# Patient Record
Sex: Female | Born: 1995
Health system: Southern US, Community
[De-identification: ages and names within clinical notes are randomized; demographics above are authoritative.]

## PROBLEM LIST (undated history)

## (undated) ENCOUNTER — Emergency Department (HOSPITAL_COMMUNITY): Admission: EM | Disposition: A | Discharge status: Home / Self Care | Payer: Self-pay

## (undated) DIAGNOSIS — Z789 Other specified health status: Secondary | ICD-10-CM

## (undated) DIAGNOSIS — O24419 Gestational diabetes mellitus in pregnancy, unspecified control: Secondary | ICD-10-CM

## (undated) HISTORY — DX: Gestational diabetes mellitus in pregnancy, unspecified control: O24.419

## (undated) HISTORY — DX: Other specified health status: Z78.9

## (undated) HISTORY — PX: NO PAST SURGERIES: SHX2092

---

## 2019-11-24 ENCOUNTER — Encounter (HOSPITAL_COMMUNITY): Payer: Self-pay | Admitting: Emergency Medicine

## 2019-11-24 ENCOUNTER — Inpatient Hospital Stay (HOSPITAL_COMMUNITY)
Admission: EM | Admit: 2019-11-24 | Discharge: 2019-11-26 | DRG: 392 | Disposition: A | Discharge status: Home / Self Care | Payer: Self-pay | Attending: Internal Medicine | Admitting: Internal Medicine

## 2019-11-24 ENCOUNTER — Emergency Department (HOSPITAL_COMMUNITY): Payer: Self-pay

## 2019-11-24 ENCOUNTER — Other Ambulatory Visit: Payer: Self-pay

## 2019-11-24 DIAGNOSIS — K5732 Diverticulitis of large intestine without perforation or abscess without bleeding: Principal | ICD-10-CM | POA: Diagnosis present

## 2019-11-24 DIAGNOSIS — R6510 Systemic inflammatory response syndrome (SIRS) of non-infectious origin without acute organ dysfunction: Secondary | ICD-10-CM | POA: Diagnosis present

## 2019-11-24 DIAGNOSIS — R935 Abnormal findings on diagnostic imaging of other abdominal regions, including retroperitoneum: Secondary | ICD-10-CM

## 2019-11-24 DIAGNOSIS — Z20822 Contact with and (suspected) exposure to covid-19: Secondary | ICD-10-CM | POA: Diagnosis present

## 2019-11-24 DIAGNOSIS — K5792 Diverticulitis of intestine, part unspecified, without perforation or abscess without bleeding: Secondary | ICD-10-CM | POA: Diagnosis present

## 2019-11-24 HISTORY — DX: Diverticulitis of large intestine without perforation or abscess without bleeding: K57.32

## 2019-11-24 LAB — URINALYSIS, ROUTINE W REFLEX MICROSCOPIC
Bilirubin Urine: NEGATIVE
Bilirubin Urine: NEGATIVE
Glucose, UA: NEGATIVE mg/dL
Glucose, UA: NEGATIVE mg/dL
Hgb urine dipstick: NEGATIVE
Hgb urine dipstick: NEGATIVE
Ketones, ur: 20 mg/dL — AB
Ketones, ur: 80 mg/dL — AB
Leukocytes,Ua: NEGATIVE
Nitrite: NEGATIVE
Nitrite: NEGATIVE
Protein, ur: 30 mg/dL — AB
Protein, ur: NEGATIVE mg/dL
Specific Gravity, Urine: 1.027 (ref 1.005–1.030)
Specific Gravity, Urine: >1.046 — ABNORMAL HIGH (ref 1.005–1.030)
pH: 5 (ref 5.0–8.0)
pH: 5 (ref 5.0–8.0)

## 2019-11-24 LAB — LACTIC ACID, PLASMA: Lactic Acid, Venous: 0.8 mmol/L (ref 0.5–1.9)

## 2019-11-24 LAB — CBC
HCT: 40.2 % (ref 36.0–46.0)
Hemoglobin: 12.9 g/dL (ref 12.0–15.0)
MCH: 28.5 pg (ref 26.0–34.0)
MCHC: 32.1 g/dL (ref 30.0–36.0)
MCV: 88.9 fL (ref 80.0–100.0)
Platelets: 243 10*3/uL (ref 150–400)
RBC: 4.52 MIL/uL (ref 3.87–5.11)
RDW: 13.6 % (ref 11.5–15.5)
WBC: 14.1 10*3/uL — ABNORMAL HIGH (ref 4.0–10.5)
nRBC: 0 % (ref 0.0–0.2)

## 2019-11-24 LAB — COMPREHENSIVE METABOLIC PANEL
ALT: 15 U/L (ref 0–44)
AST: 16 U/L (ref 15–41)
Albumin: 3.5 g/dL (ref 3.5–5.0)
Alkaline Phosphatase: 77 U/L (ref 38–126)
Anion gap: 9 (ref 5–15)
BUN: 7 mg/dL (ref 6–20)
CO2: 25 mmol/L (ref 22–32)
Calcium: 8.8 mg/dL — ABNORMAL LOW (ref 8.9–10.3)
Chloride: 102 mmol/L (ref 98–111)
Creatinine, Ser: 0.66 mg/dL (ref 0.44–1.00)
GFR, Estimated: >60 mL/min (ref >60)
Glucose, Bld: 109 mg/dL — ABNORMAL HIGH (ref 70–99)
Potassium: 4 mmol/L (ref 3.5–5.1)
Sodium: 136 mmol/L (ref 135–145)
Total Bilirubin: 0.6 mg/dL (ref 0.3–1.2)
Total Protein: 7.5 g/dL (ref 6.5–8.1)

## 2019-11-24 LAB — RESPIRATORY PANEL BY RT PCR (FLU A&B, COVID)
Influenza A by PCR: NEGATIVE
Influenza B by PCR: NEGATIVE
SARS Coronavirus 2 by RT PCR: NEGATIVE

## 2019-11-24 LAB — I-STAT BETA HCG BLOOD, ED (MC, WL, AP ONLY): I-stat hCG, quantitative: <5 m[IU]/mL (ref <5)

## 2019-11-24 LAB — LIPASE, BLOOD: Lipase: 23 U/L (ref 11–51)

## 2019-11-24 MED ORDER — METRONIDAZOLE 500 MG PO TABS
500.0000 mg | ORAL_TABLET | Freq: Three times a day (TID) | ORAL | Status: DC
  Administered 2019-11-24: 500 mg via ORAL
  Filled 2019-11-24: qty 1

## 2019-11-24 MED ORDER — IOHEXOL 300 MG/ML  SOLN
100.0000 mL | Freq: Once | INTRAMUSCULAR | Status: AC | PRN
  Administered 2019-11-24: 100 mL via INTRAVENOUS

## 2019-11-24 MED ORDER — SODIUM CHLORIDE 0.9 % IV SOLN: Freq: Once | INTRAVENOUS | Status: AC

## 2019-11-24 MED ORDER — OXYCODONE-ACETAMINOPHEN 5-325 MG PO TABS
1.0000 | ORAL_TABLET | Freq: Once | ORAL | Status: AC
  Administered 2019-11-24: 1 via ORAL
  Filled 2019-11-24: qty 1

## 2019-11-24 MED ORDER — SODIUM CHLORIDE 0.9 % IV SOLN
2.0000 g | Freq: Once | INTRAVENOUS | Status: AC
  Administered 2019-11-24: 2 g via INTRAVENOUS
  Filled 2019-11-24: qty 20

## 2019-11-24 MED ORDER — MORPHINE SULFATE (PF) 4 MG/ML IV SOLN
4.0000 mg | Freq: Once | INTRAVENOUS | Status: AC
  Administered 2019-11-24: 4 mg via INTRAVENOUS
  Filled 2019-11-24: qty 1

## 2019-11-24 NOTE — ED Triage Notes (Signed)
Patient arrives to ED with complaints of RLQ abdominal pain. Pt describes the pain has sharp. Onset x2 days ago. Pain is constant and not relieved. Denies N/V.

## 2019-11-24 NOTE — ED Provider Notes (Signed)
[facility] EMERGENCY DEPARTMENT Provider Note   CSN: [id number] Arrival date & time: 11/24/19  1144     History Chief Complaint  Patient presents with  . Abdominal Pain    Leah Garcia Leah Garcia is a 24 y.o. female with no previous PMH presents to ER for evaluation of abdominal pain. Onset 2 days ago. Feels like a cramp like she has to have a bowel movement. Located in right upper/middle abdomen with radiation to right middle back. Constant. Currently 6/10. Not worse with meals. Worse with sitting up, moving.  Has tried acetaminophen which has not helped. No associated fever, nausea, vomiting. No associated chest or shoulder pain. No cough, SOB. No diarrhea. Feels a little constipated, last BM this morning "small".  Passing flatus. No dysuria, hematuria, frequency. No abdominal vaginal bleeding or discharge. Irregular menses due to Nexplanon. No previous abdominal surgeries.   HPI     History reviewed. No pertinent past medical history.  There are no problems to display for this patient.  ** The histories are not reviewed yet. Please review them in the "History" navigator section and refresh this SmartLink.   OB History   No obstetric history on file.     History reviewed. No pertinent family history.  Social History   Tobacco Use  . Smoking status: Not on file  Substance Use Topics  . Alcohol use: Not on file  . Drug use: Not on file    Home Medications Prior to Admission medications   Not on File    Allergies    Patient has no known allergies.  Review of Systems   Review of Systems  Gastrointestinal: Positive for abdominal pain.  All other systems reviewed and are negative.   Physical Exam Updated Vital Signs BP 115/65   Pulse 91   Temp 99.7 F (37.6 C) (Oral)   Resp 20   SpO2 100%   Physical Exam Vitals and nursing note reviewed.  Constitutional:      Appearance: She is well-developed.     Comments: Non toxic in NAD    HENT:     Head: Normocephalic and atraumatic.     Nose: Nose normal.  Eyes:     Conjunctiva/sclera: Conjunctivae normal.  Cardiovascular:     Rate and Rhythm: Normal rate and regular rhythm.  Pulmonary:     Effort: Pulmonary effort is normal.     Breath sounds: Normal breath sounds.  Abdominal:     General: Bowel sounds are normal.     Palpations: Abdomen is soft.     Tenderness: There is abdominal tenderness (RUQ and right mid abdominal) in the right upper quadrant. Negative signs include Murphy's sign.       Comments: Diffuse right sided tenderness, point of maximal tenderness in right mid abdomen. Less tenderness in RLQ. Guarding mostly with deep palpation right mid abdomen. Soft +Murphy's and McBurney's. Obese. Soft. Non distended no fluid wave. Nosuprapubic or CVA tenderness.  Active BS to lower quadrants. No rash to abdomen/back   Musculoskeletal:        General: Normal range of motion.     Cervical back: Normal range of motion.  Skin:    General: Skin is warm and dry.     Capillary Refill: Capillary refill takes less than 2 seconds.  Neurological:     Mental Status: She is alert.  Psychiatric:        Behavior: Behavior normal.     ED Results / Procedures / Treatments  Labs (all labs ordered are listed, but only abnormal results are displayed) Labs Reviewed  COMPREHENSIVE METABOLIC PANEL - Abnormal; Notable for the following components:      Result Value   Glucose, Bld 109 (*)    Calcium 8.8 (*)    All other components within normal limits  CBC - Abnormal; Notable for the following components:   WBC 14.1 (*)    All other components within normal limits  URINALYSIS, ROUTINE W REFLEX MICROSCOPIC - Abnormal; Notable for the following components:   Color, Urine AMBER (*)    APPearance CLOUDY (*)    Ketones, ur 20 (*)    Protein, ur 30 (*)    Leukocytes,Ua MODERATE (*)    Bacteria, UA RARE (*)    All other components within normal limits  URINE CULTURE  URINE  CULTURE  CULTURE, BLOOD (ROUTINE X 2)  CULTURE, BLOOD (ROUTINE X 2)  LIPASE, BLOOD  URINALYSIS, ROUTINE W REFLEX MICROSCOPIC  LACTIC ACID, PLASMA  LACTIC ACID, PLASMA  I-STAT BETA HCG BLOOD, ED ([facility], [facility], [facility] ONLY)    EKG None  Radiology US Abdomen Complete  Result Date: 11/24/2019 CLINICAL DATA:  Right upper quadrant pain. Evaluate gallbladder and right kidney. EXAM: ABDOMEN ULTRASOUND COMPLETE COMPARISON:  None. FINDINGS: Gallbladder: No gallstones or wall thickening visualized. No sonographic Murphy sign noted by sonographer. Common bile duct: Diameter: 3 mm Liver: No focal lesion identified. Within normal limits in parenchymal echogenicity. Portal vein is patent on color Doppler imaging with normal direction of blood flow towards the liver. IVC: No abnormality visualized. Pancreas: Visualized portion unremarkable. Spleen: Size and appearance within normal limits. Right Kidney: Length: 10.8 cm. Echogenicity within normal limits. No mass or hydronephrosis visualized. Left Kidney: Length: 10.8 cm. Echogenicity within normal limits. No mass or hydronephrosis visualized. Abdominal aorta: No aneurysm visualized. Other findings: None. IMPRESSION: No cause for pain identified. The gallbladder and right kidney in particular normal in appearance. Electronically Signed   By: Gerome Samavid  Williams III M.D   On: 11/24/2019 17:18   CT ABDOMEN PELVIS W CONTRAST  Result Date: 11/24/2019 CLINICAL DATA:  Two days of right upper quadrant pain EXAM: CT ABDOMEN AND PELVIS WITH CONTRAST TECHNIQUE: Multidetector CT imaging of the abdomen and pelvis was performed using the standard protocol following bolus administration of intravenous contrast. CONTRAST:  100mL OMNIPAQUE IOHEXOL 300 MG/ML  SOLN COMPARISON:  Abdominal ultrasound 11/24/2019 FINDINGS: Lower chest: Lung bases are clear. Normal heart size. No pericardial effusion. Hepatobiliary: No worrisome focal liver lesions. Smooth liver surface contour. Normal hepatic  attenuation. Normal gallbladder and biliary tree. Pancreas: No pancreatic ductal dilatation or surrounding inflammatory changes. Spleen: Normal in size. No concerning splenic lesions. Adrenals/Urinary Tract: Normal adrenal glands. Kidneys are normally located with symmetric enhancement. No suspicious renal lesion, urolithiasis or hydronephrosis. Urinary bladder is largely decompressed at the time of exam and therefore poorly evaluated by CT imaging. Mild wall thickening likely related to underdistention without other gross bladder abnormality. Stomach/Bowel: Distal esophagus, stomach and duodenum are unremarkable. No localized small bowel thickening or dilatation. There is phlegmon and inflammatory changes which appear largely centered upon the ascending colon with a likely culprit diverticulum (6/62, 3/57) while inflammation is present in the vicinity of the appendix, this is favored to be reactive though a small tip appendicitis cannot be fully excluded given some more focal thickening and enhancement at this level (6/68, 3/58). More distal colonic segments are unremarkable. Vascular/Lymphatic: Reactive adenopathy in the right lower quadrant. No pathologically enlarged abdominopelvic nodes. No  significant vascular abnormalities. Reproductive: Retroflexed uterus.  No concerning adnexal lesions. Other: Trace fluid and phlegmon about the ascending colon and cecum, largely centered upon the suspected culprit diverticulum. Trace simple attenuation free fluid in the pelvis, likely physiologic or redistributed inflammatory fluid. No free air. No organized collection or abscess. Musculoskeletal: No acute osseous abnormality or suspicious osseous lesion. Mild degenerative changes in the lower lumbar levels IMPRESSION: 1. Inflammatory changes appear centered upon a culprit diverticulum of the ascending colon favoring acute right-sided diverticulitis. 2. Inflammation adjacent the appendix is favored to be reactive though a  small tip appendicitis cannot be fully excluded given some more focal thickening and enhancement at this level. 3. Trace simple attenuation free fluid in the pelvis, likely physiologic or redistributed inflammatory fluid. 4. Mild bladder wall thickening likely related to underdistention without other gross bladder abnormality. Recommend assessment for urinary symptoms and consider urinalysis if present to exclude cystitis. Electronically Signed   By: Kreg Shropshire M.D.   On: 11/24/2019 20:27    Procedures Procedures (including critical care time)  Medications Ordered in ED Medications  cefTRIAXone (ROCEPHIN) 2 g in sodium chloride 0.9 % 100 mL IVPB (has no administration in time range)    And  metroNIDAZOLE (FLAGYL) tablet 500 mg (has no administration in time range)  0.9 %  sodium chloride infusion (has no administration in time range)  morphine 4 MG/ML injection 4 mg (has no administration in time range)  oxyCODONE-acetaminophen (PERCOCET/ROXICET) 5-325 MG per tablet 1 tablet (1 tablet Oral Given 11/24/19 1720)  iohexol (OMNIPAQUE) 300 MG/ML solution 100 mL (100 mLs Intravenous Contrast Given 11/24/19 1951)    ED Course  I have reviewed the triage vital signs and the nursing notes.  Pertinent labs & imaging results that were available during my care of the patient were reviewed by me and considered in my medical decision making (see chart for details).  Clinical Course as of Nov 23 2104  Sat Nov 24, 2019  1751 Glori LuisMarland Kitchen): MODERATE [CG]  1805 RBC / HPF: 6-10 [CG]  1805 WBC, UA: 11-20 [CG]  1805 Bacteria, UA(!): RARE [CG]  1805 Squamous Epithelial / LPF: 21-50 [CG]  2037 IMPRESSION: 1. Inflammatory changes appear centered upon a culprit diverticulum of the ascending colon favoring acute right-sided diverticulitis. 2. Inflammation adjacent the appendix is favored to be reactive though a small tip appendicitis cannot be fully excluded given some more focal thickening and enhancement  at this level. 3. Trace simple attenuation free fluid in the pelvis, likely physiologic or redistributed inflammatory fluid. 4. Mild bladder wall thickening likely related to underdistention without other gross bladder abnormality. Recommend assessment for urinary symptoms and consider urinalysis if present to exclude cystitis.    CT ABDOMEN PELVIS W CONTRAST [CG]    Clinical Course User Index [CG] Jerrell Mylar   MDM Rules/Calculators/A&P                          EMR, triage nurse notes reviewed to assist with history and MDM.  No significant medical history on EMR.  Healthy 24 year old F here for diffuse right-sided abdominal tenderness.  Maximal point of tenderness is in the mid right abdomen.  Reports feeling slightly constipated but had one small BM earlier this morning, still passing gas.  No fever, nausea, vomiting, urinary symptoms.  Pain is constant worse with sitting up, moving and focal pressure.  She has no vaginal symptoms.  Ddx viral gastro vs cholecystitis vs cholilithiasis  vs UTI/stone vs appy. Doubt pelvic process like torsion, PID, TOA.   ER work-up initiated in triage including lab work.  ER work-up personally visualized and interpreted  Lab work reviewed-mild leukocytosis 14.1.  Urinalysis is a poor sample with 21-50 squamous epithelial cells, rare bacteria, 11-20 RBC, 6-10 RBCs, moderate leukocytes but no nitrites.  Patient has no UTI symptoms.  No history of kidney stones.  Imaging reviewed-RUQ ultrasound obtained initially giving area of focal tenderness, this is negative for gallbladder pathology, right kidney without hydronephrosis.  Patient was reevaluated, serial abdominal exams with persistent right sided mostly middle abdominal tenderness.  Given continued pain, leukocytosis concern for appendicitis, diverticulitis.  Less likely UTI, kidney stone or pyelonephritis as she has no UTI symptoms.  Will obtain a CT A/P.  2045: CT A/P personally reviewed  and interpreted with EDP.  There are inflammatory changes centered around a diverticulum of the ascending colon, findings favoring acute right-sided diverticulitis.  Some inflammation adjacent to the appendix favored to be reactive but tip appendicitis cannot be excluded.  Some bladder wall thickening as well likely related from under distention, again patient without UTI symptoms.  Will attempt to recollect clean catch urinalysis and send for culture.  2103: Spoke to Dr Bedelia Person with surgery. Recommends admission, IV. GSY will consult. Discussed with patient, reports continued pain. TTP right mid abdomen, umbilicus, RLQ.   Meds ordered: rocephin/flagyl, morphine, NS infusion.  Final Clinical Impression(s) / ED Diagnoses Final diagnoses:  SIRS (systemic inflammatory response syndrome) (HCC)  Abnormal CT of the abdomen    Rx / DC Orders ED Discharge Orders    None       Jerrell Mylar 11/24/19 2106    Charlynne Pander, MD 11/24/19 2239

## 2019-11-24 NOTE — Care Management (Signed)
Assigned PCP [facility] needs to call on Monday and make appointment to follow up.

## 2019-11-24 NOTE — Consult Note (Signed)
Reason for Consult/Chief Complaint: diverticulitis Consultant: Margarette Asal, [place]  Vertis Kelch [name] is an 24 y.o. female.   HPI: 57F with one day history of right lower quadrant abdominal pain with no other associated symptoms, including nausea or vomiting. Reports a normal bowel movement 11/19 and a small bowel movement this AM. Denies any blood in her stool. Denies a personal or family history of colon cancer, IBS, or Crohns. Denies a personal history of UC, reports maternal grandmother with UC, she thinks. No prior colonoscopy, never been seen by GI.   History reviewed. No pertinent past medical history.  History reviewed. No pertinent family history.  Social History:  has no history on file for tobacco use, alcohol use, and drug use.  Allergies: No Known Allergies  Medications: I have reviewed the patient's current medications.  Results for orders placed or performed during the hospital encounter of 11/24/19 (from the past 48 hour(s))  Lipase, blood     Status: None   Collection Time: 11/24/19 12:31 PM  Result Value Ref Range   Lipase 23 11 - 51 U/L    Comment: Performed at [facility], [address] [place], [place] [address]  Comprehensive metabolic panel     Status: Abnormal   Collection Time: 11/24/19 12:31 PM  Result Value Ref Range   Sodium 136 135 - 145 mmol/L   Potassium 4.0 3.5 - 5.1 mmol/L   Chloride 102 98 - 111 mmol/L   CO2 25 22 - 32 mmol/L   Glucose, Bld 109 (H) 70 - 99 mg/dL    Comment: Glucose reference range applies only to samples taken after fasting for at least 8 hours.   BUN 7 6 - 20 mg/dL   Creatinine, Ser 5.46 0.44 - 1.00 mg/dL   Calcium 8.8 (L) 8.9 - 10.3 mg/dL   Total Protein 7.5 6.5 - 8.1 g/dL   Albumin 3.5 3.5 - 5.0 g/dL   AST 16 15 - 41 U/L   ALT 15 0 - 44 U/L   Alkaline Phosphatase 77 38 - 126 U/L   Total Bilirubin 0.6 0.3 - 1.2 mg/dL   GFR, Estimated >56 >81 mL/min    Comment: (NOTE) Calculated using the CKD-EPI  Creatinine Equation (2021)    Anion gap 9 5 - 15    Comment: Performed at [facility], [address], [place], [place] [address]  CBC     Status: Abnormal   Collection Time: 11/24/19 12:31 PM  Result Value Ref Range   WBC 14.1 (H) 4.0 - 10.5 K/uL   RBC 4.52 3.87 - 5.11 MIL/uL   Hemoglobin 12.9 12.0 - 15.0 g/dL   HCT 00.1 36 - 46 %   MCV 88.9 80.0 - 100.0 fL   MCH 28.5 26.0 - 34.0 pg   MCHC 32.1 30.0 - 36.0 g/dL   RDW 74.9 44.9 - 67.5 %   Platelets 243 150 - 400 K/uL   nRBC 0.0 0.0 - 0.2 %    Comment: Performed at [facility], [address] [place], [place] [address]  I-Stat beta hCG blood, ED     Status: None   Collection Time: 11/24/19 12:55 PM  Result Value Ref Range   I-stat hCG, quantitative <5.0 <5 mIU/mL   Comment 3            Comment:   GEST. AGE      CONC.  (mIU/mL)   <=1 WEEK  5 - 50     2 WEEKS       50 - 500     3 WEEKS       100 - 10,000     4 WEEKS     1,000 - 30,000        FEMALE AND NON-PREGNANT FEMALE:     LESS THAN 5 mIU/mL   Urinalysis, Routine w reflex microscopic Urine, Clean Catch     Status: Abnormal   Collection Time: 11/24/19  5:19 PM  Result Value Ref Range   Color, Urine AMBER (A) YELLOW    Comment: BIOCHEMICALS MAY BE AFFECTED BY COLOR   APPearance CLOUDY (A) CLEAR   Specific Gravity, Urine 1.027 1.005 - 1.030   pH 5.0 5.0 - 8.0   Glucose, UA NEGATIVE NEGATIVE mg/dL   Hgb urine dipstick NEGATIVE NEGATIVE   Bilirubin Urine NEGATIVE NEGATIVE   Ketones, ur 20 (A) NEGATIVE mg/dL   Protein, ur 30 (A) NEGATIVE mg/dL   Nitrite NEGATIVE NEGATIVE   Leukocytes,Ua MODERATE (A) NEGATIVE   RBC / HPF 6-10 0 - 5 RBC/hpf   WBC, UA 11-20 0 - 5 WBC/hpf   Bacteria, UA RARE (A) NONE SEEN   Squamous Epithelial / LPF 21-50 0 - 5   Mucus PRESENT     Comment: Performed at [facility], [address], [place], [place] [address]    US Abdomen Complete  Result Date: 11/24/2019 CLINICAL DATA:  Right upper quadrant pain. Evaluate  gallbladder and right kidney. EXAM: ABDOMEN ULTRASOUND COMPLETE COMPARISON:  None. FINDINGS: Gallbladder: No gallstones or wall thickening visualized. No sonographic Murphy sign noted by sonographer. Common bile duct: Diameter: 3 mm Liver: No focal lesion identified. Within normal limits in parenchymal echogenicity. Portal vein is patent on color Doppler imaging with normal direction of blood flow towards the liver. IVC: No abnormality visualized. Pancreas: Visualized portion unremarkable. Spleen: Size and appearance within normal limits. Right Kidney: Length: 10.8 cm. Echogenicity within normal limits. No mass or hydronephrosis visualized. Left Kidney: Length: 10.8 cm. Echogenicity within normal limits. No mass or hydronephrosis visualized. Abdominal aorta: No aneurysm visualized. Other findings: None. IMPRESSION: No cause for pain identified. The gallbladder and right kidney in particular normal in appearance. Electronically Signed   By: Gerome Sam III M.D   On: 11/24/2019 17:18   CT ABDOMEN PELVIS W CONTRAST  Result Date: 11/24/2019 CLINICAL DATA:  Two days of right upper quadrant pain EXAM: CT ABDOMEN AND PELVIS WITH CONTRAST TECHNIQUE: Multidetector CT imaging of the abdomen and pelvis was performed using the standard protocol following bolus administration of intravenous contrast. CONTRAST:  OMNIPAQUE IOHEXOL 300 MG/ML  SOLN COMPARISON:  Abdominal ultrasound 11/24/2019 FINDINGS: Lower chest: Lung bases are clear. Normal heart size. No pericardial effusion. Hepatobiliary: No worrisome focal liver lesions. Smooth liver surface contour. Normal hepatic attenuation. Normal gallbladder and biliary tree. Pancreas: No pancreatic ductal dilatation or surrounding inflammatory changes. Spleen: Normal in size. No concerning splenic lesions. Adrenals/Urinary Tract: Normal adrenal glands. Kidneys are normally located with symmetric enhancement. No suspicious renal lesion, urolithiasis or hydronephrosis.  Urinary bladder is largely decompressed at the time of exam and therefore poorly evaluated by CT imaging. Mild wall thickening likely related to underdistention without other gross bladder abnormality. Stomach/Bowel: Distal esophagus, stomach and duodenum are unremarkable. No localized small bowel thickening or dilatation. There is phlegmon and inflammatory changes which appear largely centered upon the ascending colon with a likely culprit diverticulum (6/62, 3/57) while inflammation is present  in the vicinity of the appendix, this is favored to be reactive though a small tip appendicitis cannot be fully excluded given some more focal thickening and enhancement at this level (6/68, 3/58). More distal colonic segments are unremarkable. Vascular/Lymphatic: Reactive adenopathy in the right lower quadrant. No pathologically enlarged abdominopelvic nodes. No significant vascular abnormalities. Reproductive: Retroflexed uterus.  No concerning adnexal lesions. Other: Trace fluid and phlegmon about the ascending colon and cecum, largely centered upon the suspected culprit diverticulum. Trace simple attenuation free fluid in the pelvis, likely physiologic or redistributed inflammatory fluid. No free air. No organized collection or abscess. Musculoskeletal: No acute osseous abnormality or suspicious osseous lesion. Mild degenerative changes in the lower lumbar levels IMPRESSION: 1. Inflammatory changes appear centered upon a culprit diverticulum of the ascending colon favoring acute right-sided diverticulitis. 2. Inflammation adjacent the appendix is favored to be reactive though a small tip appendicitis cannot be fully excluded given some more focal thickening and enhancement at this level. 3. Trace simple attenuation free fluid in the pelvis, likely physiologic or redistributed inflammatory fluid. 4. Mild bladder wall thickening likely related to underdistention without other gross bladder abnormality. Recommend assessment  for urinary symptoms and consider urinalysis if present to exclude cystitis. Electronically Signed   By: Kreg Shropshire M.D.   On: 11/24/2019 20:27    ROS 10 point review of systems is negative except as listed above in HPI.   Physical Exam Blood pressure 107/75, pulse 93, temperature 99.3 F (37.4 C), temperature source Oral, resp. rate 20, SpO2 100 %. Constitutional: well-developed, well-nourished HEENT: pupils equal, round, reactive to light, 99mm b/l, moist conjunctiva, external inspection of ears and nose normal, hearing intact Oropharynx: normal oropharyngeal mucosa, normal dentition Neck: no thyromegaly, trachea midline, no midline cervical tenderness to palpation Chest: breath sounds equal bilaterally, normal respiratory effort, no midline or lateral chest wall tenderness to palpation/deformity Abdomen: soft, mild TTP with deep palpation in RLQ, no bruising, no hepatosplenomegaly GU: normal female genitalia  Back: no wounds, no thoracic/lumbar spine tenderness to palpation, no thoracic/lumbar spine stepoffs Rectal: deferred Extremities: 2+ radial and pedal pulses bilaterally, motor and sensation intact to bilateral UE and LE, no peripheral edema MSK: unable to assess gait/station, no clubbing/cyanosis of fingers/toes, normal ROM of all four extremities Skin: warm, dry, no rashes Psych: normal memory, normal mood/affect    Assessment/Plan: 70F with R sided diverticulitis  Recommend admission to [facility] for abx. Recommend GI c/s due to R sided presentation and young age; would recommend colonoscopy after resolution of this episode. Will continue to follow.   Diamantina Monks, MD General and Trauma Surgery [facility]

## 2019-11-25 ENCOUNTER — Encounter (HOSPITAL_COMMUNITY): Payer: Self-pay | Admitting: Internal Medicine

## 2019-11-25 DIAGNOSIS — R6510 Systemic inflammatory response syndrome (SIRS) of non-infectious origin without acute organ dysfunction: Secondary | ICD-10-CM | POA: Diagnosis present

## 2019-11-25 DIAGNOSIS — K5732 Diverticulitis of large intestine without perforation or abscess without bleeding: Principal | ICD-10-CM

## 2019-11-25 DIAGNOSIS — K5792 Diverticulitis of intestine, part unspecified, without perforation or abscess without bleeding: Secondary | ICD-10-CM | POA: Diagnosis present

## 2019-11-25 HISTORY — DX: Diverticulitis of intestine, part unspecified, without perforation or abscess without bleeding: K57.92

## 2019-11-25 HISTORY — DX: Systemic inflammatory response syndrome (sirs) of non-infectious origin without acute organ dysfunction: R65.10

## 2019-11-25 LAB — URINE CULTURE: Culture: <10000 — AB

## 2019-11-25 LAB — CBC WITH DIFFERENTIAL/PLATELET
Abs Immature Granulocytes: 0.05 10*3/uL (ref 0.00–0.07)
Basophils Absolute: 0 10*3/uL (ref 0.0–0.1)
Basophils Relative: 0 %
Eosinophils Absolute: 0.1 10*3/uL (ref 0.0–0.5)
Eosinophils Relative: 1 %
HCT: 34.1 % — ABNORMAL LOW (ref 36.0–46.0)
Hemoglobin: 11.3 g/dL — ABNORMAL LOW (ref 12.0–15.0)
Immature Granulocytes: 0 %
Lymphocytes Relative: 13 %
Lymphs Abs: 1.8 10*3/uL (ref 0.7–4.0)
MCH: 28.5 pg (ref 26.0–34.0)
MCHC: 33.1 g/dL (ref 30.0–36.0)
MCV: 85.9 fL (ref 80.0–100.0)
Monocytes Absolute: 1 10*3/uL (ref 0.1–1.0)
Monocytes Relative: 7 %
Neutro Abs: 10.8 10*3/uL — ABNORMAL HIGH (ref 1.7–7.7)
Neutrophils Relative %: 79 %
Platelets: 210 10*3/uL (ref 150–400)
RBC: 3.97 MIL/uL (ref 3.87–5.11)
RDW: 13.4 % (ref 11.5–15.5)
WBC: 13.8 10*3/uL — ABNORMAL HIGH (ref 4.0–10.5)
nRBC: 0 % (ref 0.0–0.2)

## 2019-11-25 LAB — COMPREHENSIVE METABOLIC PANEL
ALT: 13 U/L (ref 0–44)
AST: 12 U/L — ABNORMAL LOW (ref 15–41)
Albumin: 3 g/dL — ABNORMAL LOW (ref 3.5–5.0)
Alkaline Phosphatase: 69 U/L (ref 38–126)
Anion gap: 8 (ref 5–15)
BUN: 5 mg/dL — ABNORMAL LOW (ref 6–20)
CO2: 24 mmol/L (ref 22–32)
Calcium: 8.3 mg/dL — ABNORMAL LOW (ref 8.9–10.3)
Chloride: 103 mmol/L (ref 98–111)
Creatinine, Ser: 0.66 mg/dL (ref 0.44–1.00)
GFR, Estimated: >60 mL/min (ref >60)
Glucose, Bld: 103 mg/dL — ABNORMAL HIGH (ref 70–99)
Potassium: 3.5 mmol/L (ref 3.5–5.1)
Sodium: 135 mmol/L (ref 135–145)
Total Bilirubin: 1.1 mg/dL (ref 0.3–1.2)
Total Protein: 6.3 g/dL — ABNORMAL LOW (ref 6.5–8.1)

## 2019-11-25 LAB — LACTIC ACID, PLASMA: Lactic Acid, Venous: 0.6 mmol/L (ref 0.5–1.9)

## 2019-11-25 LAB — HIV ANTIBODY (ROUTINE TESTING W REFLEX): HIV Screen 4th Generation wRfx: NONREACTIVE

## 2019-11-25 MED ORDER — MORPHINE SULFATE (PF) 2 MG/ML IV SOLN: 2.0000 mg | INTRAVENOUS | Status: DC | PRN

## 2019-11-25 MED ORDER — ENOXAPARIN SODIUM 40 MG/0.4ML ~~LOC~~ SOLN
40.0000 mg | Freq: Every day | SUBCUTANEOUS | Status: DC
  Administered 2019-11-25 – 2019-11-26 (×2): 40 mg via SUBCUTANEOUS
  Filled 2019-11-25 (×2): qty 0.4

## 2019-11-25 MED ORDER — PIPERACILLIN-TAZOBACTAM 3.375 G IVPB: 3.3750 g | Freq: Four times a day (QID) | INTRAVENOUS | Status: DC

## 2019-11-25 MED ORDER — POLYETHYLENE GLYCOL 3350 17 G PO PACK: 17.0000 g | PACK | Freq: Every day | ORAL | Status: DC | PRN

## 2019-11-25 MED ORDER — LACTATED RINGERS IV SOLN: INTRAVENOUS | Status: AC

## 2019-11-25 MED ORDER — MORPHINE SULFATE (PF) 4 MG/ML IV SOLN
4.0000 mg | INTRAVENOUS | Status: DC | PRN
  Administered 2019-11-25: 4 mg via INTRAVENOUS
  Filled 2019-11-25: qty 1

## 2019-11-25 MED ORDER — ONDANSETRON HCL 4 MG/2ML IJ SOLN: 4.0000 mg | Freq: Four times a day (QID) | INTRAMUSCULAR | Status: DC | PRN

## 2019-11-25 MED ORDER — LACTATED RINGERS IV BOLUS
1000.0000 mL | Freq: Once | INTRAVENOUS | Status: AC
  Administered 2019-11-25: 1000 mL via INTRAVENOUS

## 2019-11-25 MED ORDER — ACETAMINOPHEN 325 MG PO TABS
650.0000 mg | ORAL_TABLET | Freq: Four times a day (QID) | ORAL | Status: DC | PRN
  Administered 2019-11-25 (×2): 650 mg via ORAL
  Filled 2019-11-25 (×2): qty 2

## 2019-11-25 MED ORDER — ACETAMINOPHEN 650 MG RE SUPP: 650.0000 mg | Freq: Four times a day (QID) | RECTAL | Status: DC | PRN

## 2019-11-25 MED ORDER — PIPERACILLIN-TAZOBACTAM 3.375 G IVPB
3.3750 g | Freq: Three times a day (TID) | INTRAVENOUS | Status: DC
  Administered 2019-11-25 – 2019-11-26 (×5): 3.375 g via INTRAVENOUS
  Filled 2019-11-25 (×6): qty 50

## 2019-11-25 MED ORDER — ONDANSETRON HCL 4 MG PO TABS: 4.0000 mg | ORAL_TABLET | Freq: Four times a day (QID) | ORAL | Status: DC | PRN

## 2019-11-25 NOTE — Progress Notes (Signed)
Subjective/Chief Complaint: Pt with some con't pain not worse   Objective: Vital signs in last 24 hours: Temp:  [99.1 F (37.3 C)-99.7 F (37.6 C)] 99.6 F (37.6 C) (11/21 0419) Pulse Rate:  [91-106] 93 (11/21 0906) Resp:  [18-31] 20 (11/21 0419) BP: (96-122)/(60-84) 99/65 (11/21 0906) SpO2:  [97 %-100 %] 97 % (11/21 0419) Weight:  [81.6 kg] 81.6 kg (11/20 2310) Last BM Date: 11/25/19  Intake/Output from previous day: 11/20 0701 - 11/21 0700 In: 120 [P.O.:120] Out: -  Intake/Output this shift: No intake/output data recorded.  PE:  Constitutional: No acute distress, conversant, appears states age. Eyes: Anicteric sclerae, moist conjunctiva, no lid lag Lungs: Clear to auscultation bilaterally, normal respiratory effort CV: regular rate and rhythm, no murmurs, no peripheral edema, pedal pulses 2+ GI: Soft, no masses or hepatosplenomegaly, min-tender to palpation to RLQ Skin: No rashes, palpation reveals normal turgor Psychiatric: appropriate judgment and insight, oriented to person, place, and time   Lab Results:  Recent Labs    11/24/19 1231 11/25/19 0536  WBC 14.1* 13.8*  HGB 12.9 11.3*  HCT 40.2 34.1*  PLT 243 210   BMET Recent Labs    11/24/19 1231 11/25/19 0536  NA 136 135  K 4.0 3.5  CL 102 103  CO2 25 24  GLUCOSE 109* 103*  BUN 7 5*  CREATININE 0.66 0.66  CALCIUM 8.8* 8.3*   PT/INR No results for input(s): LABPROT, INR in the last 72 hours. ABG No results for input(s): PHART, HCO3 in the last 72 hours.  Invalid input(s): PCO2, PO2  Studies/Results: US Abdomen Complete  Result Date: 11/24/2019 CLINICAL DATA:  Right upper quadrant pain. Evaluate gallbladder and right kidney. EXAM: ABDOMEN ULTRASOUND COMPLETE COMPARISON:  None. FINDINGS: Gallbladder: No gallstones or wall thickening visualized. No sonographic Murphy sign noted by sonographer. Common bile duct: Diameter: 3 mm Liver: No focal lesion identified. Within normal limits in  parenchymal echogenicity. Portal vein is patent on color Doppler imaging with normal direction of blood flow towards the liver. IVC: No abnormality visualized. Pancreas: Visualized portion unremarkable. Spleen: Size and appearance within normal limits. Right Kidney: Length: 10.8 cm. Echogenicity within normal limits. No mass or hydronephrosis visualized. Left Kidney: Length: 10.8 cm. Echogenicity within normal limits. No mass or hydronephrosis visualized. Abdominal aorta: No aneurysm visualized. Other findings: None. IMPRESSION: No cause for pain identified. The gallbladder and right kidney in particular normal in appearance. Electronically Signed   By: Gerome Sam III M.D   On: 11/24/2019 17:18   CT ABDOMEN PELVIS W CONTRAST  Result Date: 11/24/2019 CLINICAL DATA:  Two days of right upper quadrant pain EXAM: CT ABDOMEN AND PELVIS WITH CONTRAST TECHNIQUE: Multidetector CT imaging of the abdomen and pelvis was performed using the standard protocol following bolus administration of intravenous contrast. CONTRAST:  OMNIPAQUE IOHEXOL 300 MG/ML  SOLN COMPARISON:  Abdominal ultrasound 11/24/2019 FINDINGS: Lower chest: Lung bases are clear. Normal heart size. No pericardial effusion. Hepatobiliary: No worrisome focal liver lesions. Smooth liver surface contour. Normal hepatic attenuation. Normal gallbladder and biliary tree. Pancreas: No pancreatic ductal dilatation or surrounding inflammatory changes. Spleen: Normal in size. No concerning splenic lesions. Adrenals/Urinary Tract: Normal adrenal glands. Kidneys are normally located with symmetric enhancement. No suspicious renal lesion, urolithiasis or hydronephrosis. Urinary bladder is largely decompressed at the time of exam and therefore poorly evaluated by CT imaging. Mild wall thickening likely related to underdistention without other gross bladder abnormality. Stomach/Bowel: Distal esophagus, stomach and duodenum are unremarkable. No localized small  bowel thickening or dilatation. There is phlegmon and inflammatory changes which appear largely centered upon the ascending colon with a likely culprit diverticulum (6/62, 3/57) while inflammation is present in the vicinity of the appendix, this is favored to be reactive though a small tip appendicitis cannot be fully excluded given some more focal thickening and enhancement at this level (6/68, 3/58). More distal colonic segments are unremarkable. Vascular/Lymphatic: Reactive adenopathy in the right lower quadrant. No pathologically enlarged abdominopelvic nodes. No significant vascular abnormalities. Reproductive: Retroflexed uterus.  No concerning adnexal lesions. Other: Trace fluid and phlegmon about the ascending colon and cecum, largely centered upon the suspected culprit diverticulum. Trace simple attenuation free fluid in the pelvis, likely physiologic or redistributed inflammatory fluid. No free air. No organized collection or abscess. Musculoskeletal: No acute osseous abnormality or suspicious osseous lesion. Mild degenerative changes in the lower lumbar levels IMPRESSION: 1. Inflammatory changes appear centered upon a culprit diverticulum of the ascending colon favoring acute right-sided diverticulitis. 2. Inflammation adjacent the appendix is favored to be reactive though a small tip appendicitis cannot be fully excluded given some more focal thickening and enhancement at this level. 3. Trace simple attenuation free fluid in the pelvis, likely physiologic or redistributed inflammatory fluid. 4. Mild bladder wall thickening likely related to underdistention without other gross bladder abnormality. Recommend assessment for urinary symptoms and consider urinalysis if present to exclude cystitis. Electronically Signed   By: Kreg Shropshire M.D.   On: 11/24/2019 20:27    Anti-infectives: Anti-infectives (From admission, onward)   Start     Dose/Rate Route Frequency Ordered Stop   11/26/19 0000   piperacillin-tazobactam (ZOSYN) IVPB 3.375 g  Status:  Discontinued        3.375 g 12.5 mL/hr over 240 Minutes Intravenous Every 6 hours 11/25/19 0018 11/25/19 0022   11/25/19 0200  piperacillin-tazobactam (ZOSYN) IVPB 3.375 g        3.375 g 12.5 mL/hr over 240 Minutes Intravenous Every 8 hours 11/25/19 0022     11/24/19 2200  metroNIDAZOLE (FLAGYL) tablet 500 mg  Status:  Discontinued       "And" Linked Group Details   500 mg Oral Every 8 hours 11/24/19 2101 11/25/19 0018   11/24/19 2115  cefTRIAXone (ROCEPHIN) 2 g in sodium chloride 0.9 % 100 mL IVPB       "And" Linked Group Details   2 g 200 mL/hr over 30 Minutes Intravenous  Once 11/24/19 2101 11/24/19 2253      Assessment/Plan: 29F with R sided diverticulitis  --con't abx -no plans surgery at this time -will  Need out pt c-scope. I d/w Dr. Ewing Schlein -adv to [facility]   LOS: 0 days    Axel Filler 11/25/2019

## 2019-11-25 NOTE — Progress Notes (Signed)
PROGRESS NOTE    Leah Garcia  TGG:[id number]  DOB: July 24, 1995  PCP: Patient, No Pcp Per Admit date:11/24/2019 Chief compliant: Abdominal pain 24 year old Spanish-speaking female with no past medical history who presents to [facility] emergency department with complaints of progressive abdominal pain over 2 days-right paraumbilical/lower quadrant on presentation.Patient denies any fever, dysuria, hematuria, flank pain diarrhea, nausea or vomiting.  Patient denies any sick contacts.  Patient denies regular alcohol use  (once weekly on the weekends).  ED Course: Afebrile, right upper quadrant USG did not show any evidence of hepatobiliary disease.  CT abdomen/pelvis showed ascending colon inflammation likely secondary to right-sided diverticulitis.  Since inflammation adjacent to appendix, general surgery was consulted who ruled out appendicitis and recommended admission for IV antibiotics for diverticulitis. Hospital course: Patient admitted to [facility] with IV antibiotics, clear liquid diet.   Subjective: Patient reports 5/10 right mid abdominal/paraumbilical pain.  Somewhat tachycardic with heart rate in low 100s.  Afebrile (T-max 99.70F).  Tolerating clear liquid diet okay  Objective: Vitals:   11/25/19 0001 11/25/19 0419 11/25/19 0906 11/25/19 1247  BP: 112/69 107/76 99/65 106/61  Pulse: (!) 101 (!) 102 93 84  Resp: 20 20  18   Temp: 99.1 F (37.3 C) 99.6 F (37.6 C)  98.7 F (37.1 C)  TempSrc: Oral Oral  Oral  SpO2: 100% 97%  98%  Weight:      Height:        Intake/Output Summary (Last 24 hours) at 11/25/2019 1442 Last data filed at 11/25/2019 0600 Gross per 24 hour  Intake 120 ml  Output --  Net 120 ml   Filed Weights   11/24/19 2310  Weight: 81.6 kg    Physical Examination:  General: Young female resting comfortably, no acute distress noted Head ENT: Atraumatic normocephalic, PERRLA, neck supple Heart: S1-S2 heard, regular rate and rhythm,  no murmurs.  No leg edema noted Lungs: Equal air entry bilaterally, no rhonchi or rales on exam, no accessory muscle use Abdomen: Bowel sounds heard, soft, right mid abdominal/lower quadrant tender to palpation without guarding or rebound, nondistended. No organomegaly.  No CVA tenderness Extremities: No pedal edema.  No cyanosis or clubbing. Neurological: Awake alert oriented x3, no focal weakness or numbness, strength and sensations to crude touch intact Skin: No wounds or rashes.   Data Reviewed: I have personally reviewed following labs and imaging studies  CBC: Recent Labs  Lab 11/24/19 1231 11/25/19 0536  WBC 14.1* 13.8*  NEUTROABS  --  10.8*  HGB 12.9 11.3*  HCT 40.2 34.1*  MCV 88.9 85.9  PLT 243 210   Basic Metabolic Panel: Recent Labs  Lab 11/24/19 1231 11/25/19 0536  NA 136 135  K 4.0 3.5  CL 102 103  CO2 25 24  GLUCOSE 109* 103*  BUN 7 5*  CREATININE 0.66 0.66  CALCIUM 8.8* 8.3*   GFR: Estimated Creatinine Clearance: 116.3 mL/min (by C-G formula based on SCr of 0.66 mg/dL). Liver Function Tests: Recent Labs  Lab 11/24/19 1231 11/25/19 0536  AST 16 12*  ALT 15 13  ALKPHOS 77 69  BILITOT 0.6 1.1  PROT 7.5 6.3*  ALBUMIN 3.5 3.0*   Recent Labs  Lab 11/24/19 1231  LIPASE 23   No results for input(s): AMMONIA in the last 168 hours. Coagulation Profile: No results for input(s): INR, PROTIME in the last 168 hours. Cardiac Enzymes: No results for input(s): CKTOTAL, CKMB, CKMBINDEX, TROPONINI in the last 168 hours. BNP (last 3 results) No results  for input(s): PROBNP in the last 8760 hours. HbA1C: No results for input(s): HGBA1C in the last 72 hours. CBG: No results for input(s): GLUCAP in the last 168 hours. Lipid Profile: No results for input(s): CHOL, HDL, LDLCALC, TRIG, CHOLHDL, LDLDIRECT in the last 72 hours. Thyroid Function Tests: No results for input(s): TSH, T4TOTAL, FREET4, T3FREE, THYROIDAB in the last 72 hours. Anemia Panel: No  results for input(s): VITAMINB12, FOLATE, FERRITIN, TIBC, IRON, RETICCTPCT in the last 72 hours. Sepsis Labs: Recent Labs  Lab 11/24/19 2052 11/25/19 0536  LATICACIDVEN 0.8 0.6    Recent Results (from the past 240 hour(s))  Urine culture     Status: Abnormal   Collection Time: 11/24/19  5:13 PM   Specimen: Urine, Random  Result Value Ref Range Status   Specimen Description URINE, RANDOM  Final   Special Requests NONE  Final   Culture (A)  Final    <10,000 COLONIES/mL INSIGNIFICANT GROWTH Performed at [facility], [address] [place], [place] [address]    Report Status 11/25/2019 FINAL  Final  Blood culture (routine x 2)     Status: None (Preliminary result)   Collection Time: 11/24/19  9:27 PM   Specimen: BLOOD RIGHT ARM  Result Value Ref Range Status   Specimen Description BLOOD RIGHT ARM  Final   Special Requests   Final    BOTTLES DRAWN AEROBIC AND ANAEROBIC Blood Culture adequate volume   Culture   Final    NO GROWTH < 24 HOURS Performed at [facility], [address] [place], [place] [address]    Report Status PENDING  Incomplete  Respiratory Panel by RT PCR (Flu A&B, Covid) - Nasopharyngeal Swab     Status: None   Collection Time: 11/24/19  9:27 PM   Specimen: Nasopharyngeal Swab; Nasopharyngeal(NP) swabs in vial transport medium  Result Value Ref Range Status   SARS Coronavirus 2 by RT PCR NEGATIVE NEGATIVE Final    Comment: (NOTE) SARS-CoV-2 target nucleic acids are NOT DETECTED.  The SARS-CoV-2 RNA is generally detectable in upper respiratoy specimens during the acute phase of infection. The lowest concentration of SARS-CoV-2 viral copies this assay can detect is 131 copies/mL. A negative result does not preclude SARS-Cov-2 infection and should not be used as the sole basis for treatment or other patient management decisions. A negative result may occur with  improper specimen collection/handling, submission of specimen other than  nasopharyngeal swab, presence of viral mutation(s) within the areas targeted by this assay, and inadequate number of viral copies (<131 copies/mL). A negative result must be combined with clinical observations, patient history, and epidemiological information. The expected result is Negative.  Fact Sheet for Patients:  https://www.moore.com/https://www.fda.gov/media/142436/download  Fact Sheet for Healthcare Providers:  https://www.young.biz/https://www.fda.gov/media/142435/download  This test is no t yet approved or cleared by the [place] FDA and  has been authorized for detection and/or diagnosis of SARS-CoV-2 by FDA under an Emergency Use Authorization (EUA). This EUA will remain  in effect (meaning this test can be used) for the duration of the COVID-19 declaration under Section 564(b)(1) of the Act, 21 U.S.C. section 360bbb-3(b)(1), unless the authorization is terminated or revoked sooner.     Influenza A by PCR NEGATIVE NEGATIVE Final   Influenza B by PCR NEGATIVE NEGATIVE Final    Comment: (NOTE) The Xpert Xpress SARS-CoV-2/FLU/RSV assay is intended as an aid in  the diagnosis of influenza from Nasopharyngeal swab specimens and  should not be used as a sole basis  for treatment. Nasal washings and  aspirates are unacceptable for Xpert Xpress SARS-CoV-2/FLU/RSV  testing.  Fact Sheet for Patients: https://www.moore.com/  Fact Sheet for Healthcare Providers: https://www.young.biz/  This test is not yet approved or cleared by the [place] FDA and  has been authorized for detection and/or diagnosis of SARS-CoV-2 by  FDA under an Emergency Use Authorization (EUA). This EUA will remain  in effect (meaning this test can be used) for the duration of the  Covid-19 declaration under Section 564(b)(1) of the Act, 21  U.S.C. section 360bbb-3(b)(1), unless the authorization is  terminated or revoked. Performed at [facility], [address], [place],  [place] [address]   Blood culture (routine x 2)     Status: None (Preliminary result)   Collection Time: 11/24/19  9:29 PM   Specimen: BLOOD  Result Value Ref Range Status   Specimen Description BLOOD SITE NOT SPECIFIED  Final   Special Requests   Final    BOTTLES DRAWN AEROBIC AND ANAEROBIC Blood Culture adequate volume   Culture   Final    NO GROWTH < 24 HOURS Performed at [facility], [address], [place], [place] [address]    Report Status PENDING  Incomplete      Radiology Studies: US Abdomen Complete  Result Date: 11/24/2019 CLINICAL DATA:  Right upper quadrant pain. Evaluate gallbladder and right kidney. EXAM: ABDOMEN ULTRASOUND COMPLETE COMPARISON:  None. FINDINGS: Gallbladder: No gallstones or wall thickening visualized. No sonographic Murphy sign noted by sonographer. Common bile duct: Diameter: 3 mm Liver: No focal lesion identified. Within normal limits in parenchymal echogenicity. Portal vein is patent on color Doppler imaging with normal direction of blood flow towards the liver. IVC: No abnormality visualized. Pancreas: Visualized portion unremarkable. Spleen: Size and appearance within normal limits. Right Kidney: Length: 10.8 cm. Echogenicity within normal limits. No mass or hydronephrosis visualized. Left Kidney: Length: 10.8 cm. Echogenicity within normal limits. No mass or hydronephrosis visualized. Abdominal aorta: No aneurysm visualized. Other findings: None. IMPRESSION: No cause for pain identified. The gallbladder and right kidney in particular normal in appearance. Electronically Signed   By: Gerome Sam III M.D   On: 11/24/2019 17:18   CT ABDOMEN PELVIS W CONTRAST  Result Date: 11/24/2019 CLINICAL DATA:  Two days of right upper quadrant pain EXAM: CT ABDOMEN AND PELVIS WITH CONTRAST TECHNIQUE: Multidetector CT imaging of the abdomen and pelvis was performed using the standard protocol following bolus administration of intravenous contrast. CONTRAST:   OMNIPAQUE IOHEXOL 300 MG/ML  SOLN COMPARISON:  Abdominal ultrasound 11/24/2019 FINDINGS: Lower chest: Lung bases are clear. Normal heart size. No pericardial effusion. Hepatobiliary: No worrisome focal liver lesions. Smooth liver surface contour. Normal hepatic attenuation. Normal gallbladder and biliary tree. Pancreas: No pancreatic ductal dilatation or surrounding inflammatory changes. Spleen: Normal in size. No concerning splenic lesions. Adrenals/Urinary Tract: Normal adrenal glands. Kidneys are normally located with symmetric enhancement. No suspicious renal lesion, urolithiasis or hydronephrosis. Urinary bladder is largely decompressed at the time of exam and therefore poorly evaluated by CT imaging. Mild wall thickening likely related to underdistention without other gross bladder abnormality. Stomach/Bowel: Distal esophagus, stomach and duodenum are unremarkable. No localized small bowel thickening or dilatation. There is phlegmon and inflammatory changes which appear largely centered upon the ascending colon with a likely culprit diverticulum (6/62, 3/57) while inflammation is present in the vicinity of the appendix, this is favored to be reactive though a small tip appendicitis cannot be fully excluded given some  more focal thickening and enhancement at this level (6/68, 3/58). More distal colonic segments are unremarkable. Vascular/Lymphatic: Reactive adenopathy in the right lower quadrant. No pathologically enlarged abdominopelvic nodes. No significant vascular abnormalities. Reproductive: Retroflexed uterus.  No concerning adnexal lesions. Other: Trace fluid and phlegmon about the ascending colon and cecum, largely centered upon the suspected culprit diverticulum. Trace simple attenuation free fluid in the pelvis, likely physiologic or redistributed inflammatory fluid. No free air. No organized collection or abscess. Musculoskeletal: No acute osseous abnormality or suspicious osseous lesion. Mild  degenerative changes in the lower lumbar levels IMPRESSION: 1. Inflammatory changes appear centered upon a culprit diverticulum of the ascending colon favoring acute right-sided diverticulitis. 2. Inflammation adjacent the appendix is favored to be reactive though a small tip appendicitis cannot be fully excluded given some more focal thickening and enhancement at this level. 3. Trace simple attenuation free fluid in the pelvis, likely physiologic or redistributed inflammatory fluid. 4. Mild bladder wall thickening likely related to underdistention without other gross bladder abnormality. Recommend assessment for urinary symptoms and consider urinalysis if present to exclude cystitis. Electronically Signed   By: Kreg Shropshire M.D.   On: 11/24/2019 20:27      Scheduled Meds: . enoxaparin (LOVENOX) injection  40 mg Subcutaneous Daily   Continuous Infusions: . lactated ringers 125 mL/hr at 11/25/19 1010  . piperacillin-tazobactam (ZOSYN)  IV 3.375 g (11/25/19 0913)      Assessment/Plan:   Acute diverticulitis: Continue IV fluids and empiric IV antibiotics.  Leukocytosis downtrending 14 K->13 K.  Supportive management with pain medications, advance diet as tolerated.  Full liquid diet today.  Appreciate general surgery and GI input.  Will need outpatient GI follow-up for interval colonoscopy.  DVT prophylaxis: Lovenox Code Status: Full code Family / Patient Communication: Discussed with patient Disposition Plan:   Status is: Observation  The patient requires continued stay due to IV treatments appropriate due to intensity of illness or inability to take PO  Dispo: The patient is from: Home              Anticipated d/c is to: Home              Anticipated d/c date is: 1 day              Patient currently is not medically stable to d/c.          Time spent: 25 minutes     >50% time spent in discussions with care team and coordination of care.    Alessandra Bevels, MD [facility] Pager in [place]  If 7PM-7AM, please contact night-coverage [email or web address] 11/25/2019, 2:42 PM

## 2019-11-25 NOTE — Progress Notes (Signed)
Patient's case discussed with hospital team and surgical team and CT reviewed as well as lab work and no indication for acute endoscopic intervention but will be on standby to help as needed during this hospital stay otherwise agree with treating medically for presumed diverticulitis and allow surgical team to consider laparoscopy if needed otherwise I am happy to see with a translator as an outpatient to set up outpatient colonoscopy to confirm the diagnosis

## 2019-11-25 NOTE — Progress Notes (Signed)
Utilized video interpreter for medication pass, assessment, and plan of care Pt denies questions at this time  Call light within reach  Will continue to monitor

## 2019-11-25 NOTE — H&P (Signed)
History and Physical    Leah Garcia ZOX:[id number] DOB: 1995/02/01 DOA: 11/24/2019  PCP: Patient, No Pcp Per  Patient coming from: Home   Chief Complaint:  Chief Complaint  Patient presents with  . Abdominal Pain     HPI:    23 year old Spanish-speaking female with no past medical history who presents to [facility] emergency department with complaints of abdominal pain.  Patient explains that on Thursday morning she began to experience abdominal pain.  Abdominal pain initially was mild in intensity mostly either located in the periumbilical region or right lower quadrant.  Pain was waxing and waning.  Patient is unable to identify any exacerbating or alleviating factors.  Patient denies any fever, dysuria, hematuria, flank pain diarrhea, nausea or vomiting.  Patient denies any sick contacts.  Patient denies regular alcohol use  (once weekly on the weekends).   Over the following 24 to 48 hours pain became progressively more severe.  Patient eventually presented to [facility] emergency department for evaluation.  Upon evaluation in the emergency department, due to initial concerns on examination of right upper quadrant tenderness abdominal ultrasound was performed which did not reveal any evidence of hepatobiliary disease.  This is followed up with CT imaging of the abdomen and pelvis revealing inflation of the ascending colon thought to be secondary to a right-sided diverticulitis.  Additionally, inflammation was noted adjacent to the appendix and therefore the possibility of appendicitis is also mentioned.  Urinalysis reveals no evidence of urinary tract infection but does suggest dehydration with elevated specific gravity.  Dr. Bedelia Person with General surgery was consulted who recommended hospitalization on the medical service for intravenous antibiotics and management of suspected, albeit atypical right-sided diverticulitis.  Hospitalist group was then called  to assess the patient for admission to the hospital.  Review of Systems:   Review of Systems  Gastrointestinal: Positive for abdominal pain.  All other systems reviewed and are negative.   History reviewed. No pertinent past medical history.  History reviewed. No pertinent surgical history.   reports that she has never smoked. She has never used smokeless tobacco. She reports current alcohol use of about 5.0 standard drinks of alcohol per week. She reports that she does not use drugs.  No Known Allergies  Family History  Problem Relation Age of Onset  . Heart disease Neg Hx   . Cancer Neg Hx      Prior to Admission medications   Medication Sig Start Date End Date Taking? Authorizing Provider  Acetaminophen (TYLENOL PO) Take 2 tablets by mouth every 6 (six) hours as needed (pain/headache).   Yes [provider]  Bismuth Subsalicylate (PEPTO-BISMOL PO) Take 1 tablet by mouth daily as needed (stomach pain).   Yes [provider]  etonogestrel (NEXPLANON) 68 MG IMPL implant 1 each by Subdermal route once. Implanted 2019   Yes [provider]    Physical Exam: Vitals:   11/24/19 2245 11/24/19 2300 11/24/19 2310 11/25/19 0001  BP: 102/63 105/66  112/69  Pulse: 99 100  (!) 101  Resp: (!) 26 (!) 21  20  Temp:    99.1 F (37.3 C)  TempSrc:    Oral  SpO2: 98% 100%  100%  Weight:   81.6 kg   Height:   5\' 5"  (1.651 m)     Constitutional: Acute alert and oriented x3, no associated distress.   Skin: no rashes, no lesions, somewhat poor skin turgor. Eyes: Pupils are equally reactive to light.  No evidence of scleral icterus or conjunctival pallor.  ENMT: Dry mucous membranes noted.  Posterior pharynx clear of any exudate or lesions.   Neck: normal, supple, no masses, no thyromegaly.  No evidence of jugular venous distension.   Respiratory: clear to auscultation bilaterally, no wheezing, no crackles. Normal respiratory effort. No accessory muscle use.    Cardiovascular: Regular rate and rhythm, no murmurs / rubs / gallops. No extremity edema. 2+ pedal pulses. No carotid bruits.  Chest:   Nontender without crepitus or deformity.   Back:   Nontender without crepitus or deformity. Abdomen: Abdomen is soft with significant tenderness of the right-sided of the abdomen, worst in the right upper quadrant.  No evidence of intra-abdominal masses.  Positive bowel sounds noted in all quadrants.   Musculoskeletal: No joint deformity upper and lower extremities. Good ROM, no contractures. Normal muscle tone.  Neurologic: CN 2-12 grossly intact. Sensation intact.  Patient moving all 4 extremities spontaneously.  Patient is following all commands.  Patient is responsive to verbal stimuli.   Psychiatric: Patient exhibits normal mood with appropriate affect.  Patient seems to possess insight as to their current situation.     Labs on Admission: I have personally reviewed following labs and imaging studies -   CBC: Recent Labs  Lab 11/24/19 1231  WBC 14.1*  HGB 12.9  HCT 40.2  MCV 88.9  PLT 243   Basic Metabolic Panel: Recent Labs  Lab 11/24/19 1231  NA 136  K 4.0  CL 102  CO2 25  GLUCOSE 109*  BUN 7  CREATININE 0.66  CALCIUM 8.8*   GFR: Estimated Creatinine Clearance: 116.3 mL/min (by C-G formula based on SCr of 0.66 mg/dL). Liver Function Tests: Recent Labs  Lab 11/24/19 1231  AST 16  ALT 15  ALKPHOS 77  BILITOT 0.6  PROT 7.5  ALBUMIN 3.5   Recent Labs  Lab 11/24/19 1231  LIPASE 23   No results for input(s): AMMONIA in the last 168 hours. Coagulation Profile: No results for input(s): INR, PROTIME in the last 168 hours. Cardiac Enzymes: No results for input(s): CKTOTAL, CKMB, CKMBINDEX, TROPONINI in the last 168 hours. BNP (last 3 results) No results for input(s): PROBNP in the last 8760 hours. HbA1C: No results for input(s): HGBA1C in the last 72 hours. CBG: No results for input(s): GLUCAP in the last 168 hours. Lipid  Profile: No results for input(s): CHOL, HDL, LDLCALC, TRIG, CHOLHDL, LDLDIRECT in the last 72 hours. Thyroid Function Tests: No results for input(s): TSH, T4TOTAL, FREET4, T3FREE, THYROIDAB in the last 72 hours. Anemia Panel: No results for input(s): VITAMINB12, FOLATE, FERRITIN, TIBC, IRON, RETICCTPCT in the last 72 hours. Urine analysis:    Component Value Date/Time   COLORURINE YELLOW 11/24/2019 2049   APPEARANCEUR CLEAR 11/24/2019 2049   LABSPEC >1.046 (H) 11/24/2019 2049   PHURINE 5.0 11/24/2019 2049   GLUCOSEU NEGATIVE 11/24/2019 2049   HGBUR NEGATIVE 11/24/2019 2049   BILIRUBINUR NEGATIVE 11/24/2019 2049   KETONESUR 80 (A) 11/24/2019 2049   PROTEINUR NEGATIVE 11/24/2019 2049   NITRITE NEGATIVE 11/24/2019 2049   LEUKOCYTESUR NEGATIVE 11/24/2019 2049    Radiological Exams on Admission - Personally Reviewed: US Abdomen Complete  Result Date: 11/24/2019 CLINICAL DATA:  Right upper quadrant pain. Evaluate gallbladder and right kidney. EXAM: ABDOMEN ULTRASOUND COMPLETE COMPARISON:  None. FINDINGS: Gallbladder: No gallstones or wall thickening visualized. No sonographic Murphy sign noted by sonographer. Common bile duct: Diameter: 3 mm Liver: No focal lesion identified. Within normal limits in parenchymal  echogenicity. Portal vein is patent on color Doppler imaging with normal direction of blood flow towards the liver. IVC: No abnormality visualized. Pancreas: Visualized portion unremarkable. Spleen: Size and appearance within normal limits. Right Kidney: Length: 10.8 cm. Echogenicity within normal limits. No mass or hydronephrosis visualized. Left Kidney: Length: 10.8 cm. Echogenicity within normal limits. No mass or hydronephrosis visualized. Abdominal aorta: No aneurysm visualized. Other findings: None. IMPRESSION: No cause for pain identified. The gallbladder and right kidney in particular normal in appearance. Electronically Signed   By: Gerome Sam III M.D   On: 11/24/2019 17:18     CT ABDOMEN PELVIS W CONTRAST  Result Date: 11/24/2019 CLINICAL DATA:  Two days of right upper quadrant pain EXAM: CT ABDOMEN AND PELVIS WITH CONTRAST TECHNIQUE: Multidetector CT imaging of the abdomen and pelvis was performed using the standard protocol following bolus administration of intravenous contrast. CONTRAST:  OMNIPAQUE IOHEXOL 300 MG/ML  SOLN COMPARISON:  Abdominal ultrasound 11/24/2019 FINDINGS: Lower chest: Lung bases are clear. Normal heart size. No pericardial effusion. Hepatobiliary: No worrisome focal liver lesions. Smooth liver surface contour. Normal hepatic attenuation. Normal gallbladder and biliary tree. Pancreas: No pancreatic ductal dilatation or surrounding inflammatory changes. Spleen: Normal in size. No concerning splenic lesions. Adrenals/Urinary Tract: Normal adrenal glands. Kidneys are normally located with symmetric enhancement. No suspicious renal lesion, urolithiasis or hydronephrosis. Urinary bladder is largely decompressed at the time of exam and therefore poorly evaluated by CT imaging. Mild wall thickening likely related to underdistention without other gross bladder abnormality. Stomach/Bowel: Distal esophagus, stomach and duodenum are unremarkable. No localized small bowel thickening or dilatation. There is phlegmon and inflammatory changes which appear largely centered upon the ascending colon with a likely culprit diverticulum (6/62, 3/57) while inflammation is present in the vicinity of the appendix, this is favored to be reactive though a small tip appendicitis cannot be fully excluded given some more focal thickening and enhancement at this level (6/68, 3/58). More distal colonic segments are unremarkable. Vascular/Lymphatic: Reactive adenopathy in the right lower quadrant. No pathologically enlarged abdominopelvic nodes. No significant vascular abnormalities. Reproductive: Retroflexed uterus.  No concerning adnexal lesions. Other: Trace fluid and phlegmon  about the ascending colon and cecum, largely centered upon the suspected culprit diverticulum. Trace simple attenuation free fluid in the pelvis, likely physiologic or redistributed inflammatory fluid. No free air. No organized collection or abscess. Musculoskeletal: No acute osseous abnormality or suspicious osseous lesion. Mild degenerative changes in the lower lumbar levels IMPRESSION: 1. Inflammatory changes appear centered upon a culprit diverticulum of the ascending colon favoring acute right-sided diverticulitis. 2. Inflammation adjacent the appendix is favored to be reactive though a small tip appendicitis cannot be fully excluded given some more focal thickening and enhancement at this level. 3. Trace simple attenuation free fluid in the pelvis, likely physiologic or redistributed inflammatory fluid. 4. Mild bladder wall thickening likely related to underdistention without other gross bladder abnormality. Recommend assessment for urinary symptoms and consider urinalysis if present to exclude cystitis. Electronically Signed   By: Kreg Shropshire M.D.   On: 11/24/2019 20:27    Assessment/Plan Principal Problem:   Diverticulitis of colon   Patient presents with 2 days of substantial right-sided abdominal pain  Patient found to have leukocytosis with concern for right-sided diverticulosis on CT imaging of the abdomen pelvis.  There is also mention of the possibility of appendicitis.  Patient has been evaluated by Dr. Bedelia Person with general surgery the emergency department who feels that this should be managed as a  right-sided diverticulitis.  They recommended hospitalization and initiation of intravenous antibiotics.   Considering the patient's age I find very atypical that the patient would develop diverticulitis.  General surgery is recommending a gastroenterology consultation which I agree with, patient may benefit from endoscopic work-up to ensure that this is not a malignant process.  Patient  will be placed on a clear liquid diet  Hydrating patient with intravenous isotonic fluids  Finding patient with intravenous Zosyn monotherapy for antibiotic coverage   PRN Opiate-based analgesics for associated pain  Active Problems:   SIRS (systemic inflammatory response syndrome) (HCC)  Transient tachycardia with concurrent leukocytosis suggestive of SIRS  Do not believe the patient is septic at this time but will monitor closely for progression.   Code Status:  Full code Family Communication: deferred   Status is: Observation  The patient remains OBS appropriate and will d/c before 2 midnights.  Dispo: The patient is from: Home              Anticipated d/c is to: Home              Anticipated d/c date is: 2 days              Patient currently is medically stable to d/c.        Marinda Elk MD [facility] Pager [phone]  If 7PM-7AM, please contact night-coverage [email or web address] Use universal [place] password for that web site. If you do not have the password, please call the hospital operator.  11/25/2019, 12:22 AM

## 2019-11-26 ENCOUNTER — Other Ambulatory Visit (HOSPITAL_COMMUNITY): Payer: Self-pay | Admitting: Internal Medicine

## 2019-11-26 LAB — BLOOD CULTURE ID PANEL (REFLEXED) - BCID2
A.calcoaceticus-baumannii: NOT DETECTED
Bacteroides fragilis: NOT DETECTED
Candida albicans: NOT DETECTED
Candida auris: NOT DETECTED
Candida glabrata: NOT DETECTED
Candida krusei: NOT DETECTED
Candida parapsilosis: NOT DETECTED
Candida tropicalis: NOT DETECTED
Cryptococcus neoformans/gattii: NOT DETECTED
Enterobacter cloacae complex: NOT DETECTED
Enterobacterales: NOT DETECTED
Enterococcus Faecium: NOT DETECTED
Enterococcus faecalis: NOT DETECTED
Escherichia coli: NOT DETECTED
Haemophilus influenzae: NOT DETECTED
Klebsiella aerogenes: NOT DETECTED
Klebsiella oxytoca: NOT DETECTED
Klebsiella pneumoniae: NOT DETECTED
Listeria monocytogenes: NOT DETECTED
Methicillin resistance mecA/C: NOT DETECTED
Neisseria meningitidis: NOT DETECTED
Proteus species: NOT DETECTED
Pseudomonas aeruginosa: NOT DETECTED
Salmonella species: NOT DETECTED
Serratia marcescens: NOT DETECTED
Staphylococcus aureus (BCID): NOT DETECTED
Staphylococcus epidermidis: DETECTED — AB
Staphylococcus lugdunensis: NOT DETECTED
Staphylococcus species: DETECTED — AB
Stenotrophomonas maltophilia: NOT DETECTED
Streptococcus agalactiae: NOT DETECTED
Streptococcus pneumoniae: NOT DETECTED
Streptococcus pyogenes: NOT DETECTED
Streptococcus species: DETECTED — AB

## 2019-11-26 LAB — CBC
HCT: 35.1 % — ABNORMAL LOW (ref 36.0–46.0)
Hemoglobin: 11.4 g/dL — ABNORMAL LOW (ref 12.0–15.0)
MCH: 28.9 pg (ref 26.0–34.0)
MCHC: 32.5 g/dL (ref 30.0–36.0)
MCV: 88.9 fL (ref 80.0–100.0)
Platelets: 219 10*3/uL (ref 150–400)
RBC: 3.95 MIL/uL (ref 3.87–5.11)
RDW: 13.4 % (ref 11.5–15.5)
WBC: 8.8 10*3/uL (ref 4.0–10.5)
nRBC: 0 % (ref 0.0–0.2)

## 2019-11-26 LAB — URINE CULTURE: Culture: <10000 — AB

## 2019-11-26 MED ORDER — METRONIDAZOLE 500 MG PO TABS: 500.0000 mg | ORAL_TABLET | Freq: Three times a day (TID) | ORAL | 0 refills | Status: DC

## 2019-11-26 MED ORDER — POLYETHYLENE GLYCOL 3350 17 G PO PACK: 17.0000 g | PACK | Freq: Every day | ORAL | 0 refills | Status: DC | PRN

## 2019-11-26 MED ORDER — CEFDINIR 300 MG PO CAPS: 300.0000 mg | ORAL_CAPSULE | Freq: Two times a day (BID) | ORAL | 0 refills | Status: DC

## 2019-11-26 MED ORDER — ONDANSETRON HCL 4 MG PO TABS: 4.0000 mg | ORAL_TABLET | Freq: Four times a day (QID) | ORAL | 0 refills | Status: DC | PRN

## 2019-11-26 MED FILL — CEFDINIR 300 MG CAPSULE: 300 | 5 days supply | Qty: 10 | Fill #0

## 2019-11-26 MED FILL — ONDANSETRON HCL 4 MG TABLET: 4 | 5 days supply | Qty: 20 | Fill #0

## 2019-11-26 MED FILL — metroNIDAZOLE 500 MG TABS: 500 | 5 days supply | Qty: 15 | Fill #0

## 2019-11-26 NOTE — Discharge Summary (Signed)
Physician Discharge Summary  Leah Garcia [place] ZGY:[id number] DOB: 08/05/1995 DOA: 11/24/2019  PCP: Patient, No Pcp Per  Admit date: 11/24/2019 Discharge date: 11/26/2019 Consultations: General surgery, GI  Admitted From: home Disposition: home  Discharge Diagnoses:  Principal Problem:   Diverticulitis of colon Active Problems:   SIRS (systemic inflammatory response syndrome) (HCC)   Diverticulitis   Hospital Course Summary: 24 year old Spanish-speaking female with no past medical historywho presents to [facility] emergency department with complaints of progressive abdominal pain over 2 days-right paraumbilical/lower quadrant on presentation.Patient denies any fever,dysuria, hematuria, flank pain diarrhea, nausea or vomiting. Patient denies any sick contacts. Patient denies regular alcohol use (once weekly on the weekends). ED Course: Afebrile, right upper quadrant USG did not show any evidence of hepatobiliary disease.  CT abdomen/pelvis showed ascending colon inflammation likely secondary to right-sided diverticulitis.  Since inflammation adjacent to appendix, general surgery was consulted who ruled out appendicitis and recommended admission for IV antibiotics for diverticulitis. Hospital course: Patient admitted to [facility] with IV antibiotics, clear liquid diet.   Somewhat tachycardic with heart rate in low 100s during the hospital course.  Afebrile (T-max 99.60F  Acute Diverticulitis with SIRS: Transient tachycardia with concurrent leukocytosis suggestive of SIRS. Lactate 0.6. Received IV fluids and empiric IV antibiotics.  Leukocytosis downtrending 14 K->13 K.  Supportive management with pain medications, advanced diet as tolerated.  Soft diet today.  Appreciate general surgery and GI input.  Appendicitis ruled out. Will need outpatient GI follow-up with Dr Ewing Schlein for interval colonoscopy.  Discharge Exam:   Vitals:   11/25/19 1247 11/25/19 1749 11/25/19 2225  11/26/19 0506  BP: 106/61 120/77 105/70 (!) 96/51  Pulse: 84 87 88 85  Resp: 18 17 18 18   Temp: 98.7 F (37.1 C) 97.9 F (36.6 C) 98.7 F (37.1 C) 98.2 F (36.8 C)  TempSrc: Oral Oral Oral Oral  SpO2: 98% 99% 100% 98%  Weight:      Height:        General: Pt is alert, awake, not in acute distress Cardiovascular: RRR, S1/S2 +, no rubs, no gallops Respiratory: CTA bilaterally, no wheezing, no rhonchi Abdominal: Soft, NT, ND, bowel sounds + Extremities: no edema, no cyanosis  Discharge Condition:Stable CODE STATUS: Full code Diet recommendation: Avoid gastric irritants, encourage fiber in diet Recommendations for Outpatient Follow-up: PCP in 1 week, GI clinic Dr  as scheduled in 3-4 weeks    Home Health services upon discharge: none Equipment/Devices upon discharge:none   Discharge Instructions:  Discharge Instructions    Call MD for:  difficulty breathing, headache or visual disturbances   Complete by: As directed    Call MD for:  extreme fatigue   Complete by: As directed    Call MD for:  persistant nausea and vomiting   Complete by: As directed    Call MD for:  severe uncontrolled pain   Complete by: As directed    Call MD for:  temperature >100.4   Complete by: As directed    Diet general   Complete by: As directed    Avoid gastric irritants, encourage fiber   Increase activity slowly   Complete by: As directed      Allergies as of 11/26/2019   No Known Allergies     Medication List    STOP taking these medications   PEPTO-BISMOL PO     TAKE these medications   cefdinir 300 MG capsule Commonly known as: OMNICEF Take 1 capsule (300 mg total) by mouth 2 (  two) times daily for 5 days.   metroNIDAZOLE 500 MG tablet Commonly known as: Flagyl Take 1 tablet (500 mg total) by mouth 3 (three) times daily for 5 days.   Nexplanon 68 MG Impl implant Generic drug: etonogestrel 1 each by Subdermal route once. Implanted 2019   ondansetron 4 MG  tablet Commonly known as: ZOFRAN Take 1 tablet (4 mg total) by mouth every 6 (six) hours as needed for nausea.   polyethylene glycol 17 g packet Commonly known as: MIRALAX / GLYCOLAX Take 17 g by mouth daily as needed for mild constipation.   TYLENOL PO Take 2 tablets by mouth every 6 (six) hours as needed (pain/headache).       Follow-up Information    [facility]. Call.   Why: call on monday to schedule a appointment they have primary care doctors pharmacy, finacial counselling. you need to establish a  a primary care doctor they work on sliding scale for payment Contact information: [address] [place] [phone]             No Known Allergies    The results of significant diagnostics from this hospitalization (including imaging, microbiology, ancillary and laboratory) are listed below for reference.    Labs: BNP (last 3 results) No results for input(s): BNP in the last 8760 hours. Basic Metabolic Panel: Recent Labs  Lab 11/24/19 1231 11/25/19 0536  NA 136 135  K 4.0 3.5  CL 102 103  CO2 25 24  GLUCOSE 109* 103*  BUN 7 5*  CREATININE 0.66 0.66  CALCIUM 8.8* 8.3*   Liver Function Tests: Recent Labs  Lab 11/24/19 1231 11/25/19 0536  AST 16 12*  ALT 15 13  ALKPHOS 77 69  BILITOT 0.6 1.1  PROT 7.5 6.3*  ALBUMIN 3.5 3.0*   Recent Labs  Lab 11/24/19 1231  LIPASE 23   No results for input(s): AMMONIA in the last 168 hours. CBC: Recent Labs  Lab 11/24/19 1231 11/25/19 0536  WBC 14.1* 13.8*  NEUTROABS  --  10.8*  HGB 12.9 11.3*  HCT 40.2 34.1*  MCV 88.9 85.9  PLT 243 210   Cardiac Enzymes: No results for input(s): CKTOTAL, CKMB, CKMBINDEX, TROPONINI in the last 168 hours. BNP: Invalid input(s): POCBNP CBG: No results for input(s): GLUCAP in the last 168 hours. D-Dimer No results for input(s): DDIMER in the last 72 hours. Hgb A1c No results for input(s): HGBA1C in the  last 72 hours. Lipid Profile No results for input(s): CHOL, HDL, LDLCALC, TRIG, CHOLHDL, LDLDIRECT in the last 72 hours. Thyroid function studies No results for input(s): TSH, T4TOTAL, T3FREE, THYROIDAB in the last 72 hours.  Invalid input(s): FREET3 Anemia work up No results for input(s): VITAMINB12, FOLATE, FERRITIN, TIBC, IRON, RETICCTPCT in the last 72 hours. Urinalysis    Component Value Date/Time   COLORURINE YELLOW 11/24/2019 2049   APPEARANCEUR CLEAR 11/24/2019 2049   LABSPEC >1.046 (H) 11/24/2019 2049   PHURINE 5.0 11/24/2019 2049   GLUCOSEU NEGATIVE 11/24/2019 2049   HGBUR NEGATIVE 11/24/2019 2049   BILIRUBINUR NEGATIVE 11/24/2019 2049   KETONESUR 80 (A) 11/24/2019 2049   PROTEINUR NEGATIVE 11/24/2019 2049   NITRITE NEGATIVE 11/24/2019 2049   LEUKOCYTESUR NEGATIVE 11/24/2019 2049   Sepsis Labs Invalid input(s): PROCALCITONIN,  WBC,  LACTICIDVEN Microbiology Recent Results (from the past 240 hour(s))  Urine culture     Status: Abnormal   Collection Time: 11/24/19  5:13 PM   Specimen: Urine, Random  Result Value Ref Range Status   Specimen Description URINE, RANDOM  Final   Special Requests NONE  Final   Culture (A)  Final    <10,000 COLONIES/mL INSIGNIFICANT GROWTH Performed at [facility], [address], [place], [place] [address]    Report Status 11/25/2019 FINAL  Final  Urine culture     Status: Abnormal   Collection Time: 11/24/19  9:26 PM   Specimen: Urine, Random  Result Value Ref Range Status   Specimen Description URINE, RANDOM  Final   Special Requests NONE  Final   Culture (A)  Final    <10,000 COLONIES/mL INSIGNIFICANT GROWTH Performed at [facility], [address], [place], [place] [address]    Report Status 11/26/2019 FINAL  Final  Blood culture (routine x 2)     Status: None (Preliminary result)   Collection Time: 11/24/19  9:27 PM   Specimen: BLOOD RIGHT ARM  Result Value Ref Range Status   Specimen Description BLOOD RIGHT  ARM  Final   Special Requests   Final    BOTTLES DRAWN AEROBIC AND ANAEROBIC Blood Culture adequate volume   Culture  Setup Time   Final    GRAM POSITIVE COCCI IN CLUSTERS IN BOTH AEROBIC AND ANAEROBIC BOTTLES GRAM POSITIVE COCCI IN CHAINS ANAEROBIC BOTTLE ONLY CRITICAL RESULT CALLED TO, READ BACK BY AND VERIFIED WITH: Lieutenant Diego [id number] 11/26/2019 Girtha Hake Performed at [facility], [address] [place], [place] [address]    Culture GRAM POSITIVE COCCI  Final   Report Status PENDING  Incomplete  Respiratory Panel by RT PCR (Flu A&B, Covid) - Nasopharyngeal Swab     Status: None   Collection Time: 11/24/19  9:27 PM   Specimen: Nasopharyngeal Swab; Nasopharyngeal(NP) swabs in vial transport medium  Result Value Ref Range Status   SARS Coronavirus 2 by RT PCR NEGATIVE NEGATIVE Final    Comment: (NOTE) SARS-CoV-2 target nucleic acids are NOT DETECTED.  The SARS-CoV-2 RNA is generally detectable in upper respiratoy specimens during the acute phase of infection. The lowest concentration of SARS-CoV-2 viral copies this assay can detect is 131 copies/mL. A negative result does not preclude SARS-Cov-2 infection and should not be used as the sole basis for treatment or other patient management decisions. A negative result may occur with  improper specimen collection/handling, submission of specimen other than nasopharyngeal swab, presence of viral mutation(s) within the areas targeted by this assay, and inadequate number of viral copies (<131 copies/mL). A negative result must be combined with clinical observations, patient history, and epidemiological information. The expected result is Negative.  Fact Sheet for Patients:  https://www.moore.com/  Fact Sheet for Healthcare Providers:  https://www.young.biz/  This test is no t yet approved or cleared by the [place] FDA and  has been authorized for detection and/or diagnosis of  SARS-CoV-2 by FDA under an Emergency Use Authorization (EUA). This EUA will remain  in effect (meaning this test can be used) for the duration of the COVID-19 declaration under Section 564(b)(1) of the Act, 21 U.S.C. section 360bbb-3(b)(1), unless the authorization is terminated or revoked sooner.     Influenza A by PCR NEGATIVE NEGATIVE Final   Influenza B by PCR NEGATIVE NEGATIVE Final    Comment: (NOTE) The Xpert Xpress SARS-CoV-2/FLU/RSV assay is intended as an aid in  the diagnosis of influenza from Nasopharyngeal swab specimens and  should not be used as a sole basis for treatment. Nasal washings and  aspirates are unacceptable  for Xpert Xpress SARS-CoV-2/FLU/RSV  testing.  Fact Sheet for Patients: https://www.moore.com/  Fact Sheet for Healthcare Providers: https://www.young.biz/  This test is not yet approved or cleared by the [place] FDA and  has been authorized for detection and/or diagnosis of SARS-CoV-2 by  FDA under an Emergency Use Authorization (EUA). This EUA will remain  in effect (meaning this test can be used) for the duration of the  Covid-19 declaration under Section 564(b)(1) of the Act, 21  U.S.C. section 360bbb-3(b)(1), unless the authorization is  terminated or revoked. Performed at [facility], [address], [place], [place] [address]   Blood Culture ID Panel (Reflexed)     Status: Abnormal   Collection Time: 11/24/19  9:27 PM  Result Value Ref Range Status   Enterococcus faecalis NOT DETECTED NOT DETECTED Final   Enterococcus Faecium NOT DETECTED NOT DETECTED Final   Listeria monocytogenes NOT DETECTED NOT DETECTED Final   Staphylococcus species DETECTED (A) NOT DETECTED Final    Comment: CRITICAL RESULT CALLED TO, READ BACK BY AND VERIFIED WITH: G. ABBOTT,PHARMD 0253 11/26/2019 T. TYSOR    Staphylococcus aureus (BCID) NOT DETECTED NOT DETECTED Final   Staphylococcus epidermidis DETECTED (A)  NOT DETECTED Final    Comment: CRITICAL RESULT CALLED TO, READ BACK BY AND VERIFIED WITH: G. ABBOTT,PHARMD 0253 11/26/2019 T. TYSOR    Staphylococcus lugdunensis NOT DETECTED NOT DETECTED Final   Streptococcus species DETECTED (A) NOT DETECTED Final    Comment: Not Enterococcus species, Streptococcus agalactiae, Streptococcus pyogenes, or Streptococcus pneumoniae. CRITICAL RESULT CALLED TO, READ BACK BY AND VERIFIED WITH: G. ABBOTT,PHARMD 0253 11/26/2019 T. TYSOR    Streptococcus agalactiae NOT DETECTED NOT DETECTED Final   Streptococcus pneumoniae NOT DETECTED NOT DETECTED Final   Streptococcus pyogenes NOT DETECTED NOT DETECTED Final   A.calcoaceticus-baumannii NOT DETECTED NOT DETECTED Final   Bacteroides fragilis NOT DETECTED NOT DETECTED Final   Enterobacterales NOT DETECTED NOT DETECTED Final   Enterobacter cloacae complex NOT DETECTED NOT DETECTED Final   Escherichia coli NOT DETECTED NOT DETECTED Final   Klebsiella aerogenes NOT DETECTED NOT DETECTED Final   Klebsiella oxytoca NOT DETECTED NOT DETECTED Final   Klebsiella pneumoniae NOT DETECTED NOT DETECTED Final   Proteus species NOT DETECTED NOT DETECTED Final   Salmonella species NOT DETECTED NOT DETECTED Final   Serratia marcescens NOT DETECTED NOT DETECTED Final   Haemophilus influenzae NOT DETECTED NOT DETECTED Final   Neisseria meningitidis NOT DETECTED NOT DETECTED Final   Pseudomonas aeruginosa NOT DETECTED NOT DETECTED Final   Stenotrophomonas maltophilia NOT DETECTED NOT DETECTED Final   Candida albicans NOT DETECTED NOT DETECTED Final   Candida auris NOT DETECTED NOT DETECTED Final   Candida glabrata NOT DETECTED NOT DETECTED Final   Candida krusei NOT DETECTED NOT DETECTED Final   Candida parapsilosis NOT DETECTED NOT DETECTED Final   Candida tropicalis NOT DETECTED NOT DETECTED Final   Cryptococcus neoformans/gattii NOT DETECTED NOT DETECTED Final   Methicillin resistance mecA/C NOT DETECTED NOT DETECTED Final     Comment: Performed at [facility], [address], [place], [place] [address]  Blood culture (routine x 2)     Status: None (Preliminary result)   Collection Time: 11/24/19  9:29 PM   Specimen: BLOOD  Result Value Ref Range Status   Specimen Description BLOOD SITE NOT SPECIFIED  Final   Special Requests   Final    BOTTLES DRAWN AEROBIC AND ANAEROBIC Blood Culture adequate volume   Culture   Final  NO GROWTH < 24 HOURS Performed at [facility], [address], [place], [place] [address]    Report Status PENDING  Incomplete    Procedures/Studies: US Abdomen Complete  Result Date: 11/24/2019 CLINICAL DATA:  Right upper quadrant pain. Evaluate gallbladder and right kidney. EXAM: ABDOMEN ULTRASOUND COMPLETE COMPARISON:  None. FINDINGS: Gallbladder: No gallstones or wall thickening visualized. No sonographic Murphy sign noted by sonographer. Common bile duct: Diameter: 3 mm Liver: No focal lesion identified. Within normal limits in parenchymal echogenicity. Portal vein is patent on color Doppler imaging with normal direction of blood flow towards the liver. IVC: No abnormality visualized. Pancreas: Visualized portion unremarkable. Spleen: Size and appearance within normal limits. Right Kidney: Length: 10.8 cm. Echogenicity within normal limits. No mass or hydronephrosis visualized. Left Kidney: Length: 10.8 cm. Echogenicity within normal limits. No mass or hydronephrosis visualized. Abdominal aorta: No aneurysm visualized. Other findings: None. IMPRESSION: No cause for pain identified. The gallbladder and right kidney in particular normal in appearance. Electronically Signed   By: Gerome Sam III M.D   On: 11/24/2019 17:18   CT ABDOMEN PELVIS W CONTRAST  Result Date: 11/24/2019 CLINICAL DATA:  Two days of right upper quadrant pain EXAM: CT ABDOMEN AND PELVIS WITH CONTRAST TECHNIQUE: Multidetector CT imaging of the abdomen and pelvis was performed using the standard protocol  following bolus administration of intravenous contrast. CONTRAST:  OMNIPAQUE IOHEXOL 300 MG/ML  SOLN COMPARISON:  Abdominal ultrasound 11/24/2019 FINDINGS: Lower chest: Lung bases are clear. Normal heart size. No pericardial effusion. Hepatobiliary: No worrisome focal liver lesions. Smooth liver surface contour. Normal hepatic attenuation. Normal gallbladder and biliary tree. Pancreas: No pancreatic ductal dilatation or surrounding inflammatory changes. Spleen: Normal in size. No concerning splenic lesions. Adrenals/Urinary Tract: Normal adrenal glands. Kidneys are normally located with symmetric enhancement. No suspicious renal lesion, urolithiasis or hydronephrosis. Urinary bladder is largely decompressed at the time of exam and therefore poorly evaluated by CT imaging. Mild wall thickening likely related to underdistention without other gross bladder abnormality. Stomach/Bowel: Distal esophagus, stomach and duodenum are unremarkable. No localized small bowel thickening or dilatation. There is phlegmon and inflammatory changes which appear largely centered upon the ascending colon with a likely culprit diverticulum (6/62, 3/57) while inflammation is present in the vicinity of the appendix, this is favored to be reactive though a small tip appendicitis cannot be fully excluded given some more focal thickening and enhancement at this level (6/68, 3/58). More distal colonic segments are unremarkable. Vascular/Lymphatic: Reactive adenopathy in the right lower quadrant. No pathologically enlarged abdominopelvic nodes. No significant vascular abnormalities. Reproductive: Retroflexed uterus.  No concerning adnexal lesions. Other: Trace fluid and phlegmon about the ascending colon and cecum, largely centered upon the suspected culprit diverticulum. Trace simple attenuation free fluid in the pelvis, likely physiologic or redistributed inflammatory fluid. No free air. No organized collection or abscess.  Musculoskeletal: No acute osseous abnormality or suspicious osseous lesion. Mild degenerative changes in the lower lumbar levels IMPRESSION: 1. Inflammatory changes appear centered upon a culprit diverticulum of the ascending colon favoring acute right-sided diverticulitis. 2. Inflammation adjacent the appendix is favored to be reactive though a small tip appendicitis cannot be fully excluded given some more focal thickening and enhancement at this level. 3. Trace simple attenuation free fluid in the pelvis, likely physiologic or redistributed inflammatory fluid. 4. Mild bladder wall thickening likely related to underdistention without other gross bladder abnormality. Recommend assessment for urinary symptoms and consider urinalysis if present to exclude cystitis. Electronically Signed  By: Kreg Shropshire M.D.   On: 11/24/2019 20:27     Time coordinating discharge: Over 30 minutes  SIGNED:   Alessandra Bevels, MD  [facility] 11/26/2019, 9:45 AM

## 2019-11-26 NOTE — Progress Notes (Signed)
Subjective/Chief Complaint: Feeling better - minimal if any pain. Denies n/v. Passing flatus and having non-bloody BMs   Objective: Vital signs in last 24 hours: Temp:  [97.9 F (36.6 C)-98.7 F (37.1 C)] 98.2 F (36.8 C) (11/22 0506) Pulse Rate:  [84-93] 85 (11/22 0506) Resp:  [17-18] 18 (11/22 0506) BP: (96-120)/(51-77) 96/51 (11/22 0506) SpO2:  [98 %-100 %] 98 % (11/22 0506) Last BM Date: 11/25/19  Intake/Output from previous day: 11/21 0701 - 11/22 0700 In: 2986.5 [P.O.:800; I.V.:2061; IV Piggyback:125.5] Out: -  Intake/Output this shift: No intake/output data recorded.  PE:  Constitutional: No acute distress, conversant, appears states age. Eyes: Anicteric sclerae, moist conjunctiva, no lid lag Lungs: Clear to auscultation bilaterally, normal respiratory effort CV: regular rate and rhythm, no murmurs, no peripheral edema, pedal pulses 2+ GI: Soft, obese, no masses or hepatosplenomegaly, nontender throughout; no rebound nor guarding. Skin: No rashes, palpation reveals normal turgor Psychiatric: appropriate judgment and insight, oriented to person, place, and time   Lab Results:  Recent Labs    11/24/19 1231 11/25/19 0536  WBC 14.1* 13.8*  HGB 12.9 11.3*  HCT 40.2 34.1*  PLT 243 210   BMET Recent Labs    11/24/19 1231 11/25/19 0536  NA 136 135  K 4.0 3.5  CL 102 103  CO2 25 24  GLUCOSE 109* 103*  BUN 7 5*  CREATININE 0.66 0.66  CALCIUM 8.8* 8.3*   PT/INR No results for input(s): LABPROT, INR in the last 72 hours. ABG No results for input(s): PHART, HCO3 in the last 72 hours.  Invalid input(s): PCO2, PO2  Studies/Results: US Abdomen Complete  Result Date: 11/24/2019 CLINICAL DATA:  Right upper quadrant pain. Evaluate gallbladder and right kidney. EXAM: ABDOMEN ULTRASOUND COMPLETE COMPARISON:  None. FINDINGS: Gallbladder: No gallstones or wall thickening visualized. No sonographic Murphy sign noted by sonographer. Common bile duct: Diameter:  3 mm Liver: No focal lesion identified. Within normal limits in parenchymal echogenicity. Portal vein is patent on color Doppler imaging with normal direction of blood flow towards the liver. IVC: No abnormality visualized. Pancreas: Visualized portion unremarkable. Spleen: Size and appearance within normal limits. Right Kidney: Length: 10.8 cm. Echogenicity within normal limits. No mass or hydronephrosis visualized. Left Kidney: Length: 10.8 cm. Echogenicity within normal limits. No mass or hydronephrosis visualized. Abdominal aorta: No aneurysm visualized. Other findings: None. IMPRESSION: No cause for pain identified. The gallbladder and right kidney in particular normal in appearance. Electronically Signed   By: Gerome Sam III M.D   On: 11/24/2019 17:18   CT ABDOMEN PELVIS W CONTRAST  Result Date: 11/24/2019 CLINICAL DATA:  Two days of right upper quadrant pain EXAM: CT ABDOMEN AND PELVIS WITH CONTRAST TECHNIQUE: Multidetector CT imaging of the abdomen and pelvis was performed using the standard protocol following bolus administration of intravenous contrast. CONTRAST:  OMNIPAQUE IOHEXOL 300 MG/ML  SOLN COMPARISON:  Abdominal ultrasound 11/24/2019 FINDINGS: Lower chest: Lung bases are clear. Normal heart size. No pericardial effusion. Hepatobiliary: No worrisome focal liver lesions. Smooth liver surface contour. Normal hepatic attenuation. Normal gallbladder and biliary tree. Pancreas: No pancreatic ductal dilatation or surrounding inflammatory changes. Spleen: Normal in size. No concerning splenic lesions. Adrenals/Urinary Tract: Normal adrenal glands. Kidneys are normally located with symmetric enhancement. No suspicious renal lesion, urolithiasis or hydronephrosis. Urinary bladder is largely decompressed at the time of exam and therefore poorly evaluated by CT imaging. Mild wall thickening likely related to underdistention without other gross bladder abnormality. Stomach/Bowel: Distal  esophagus, stomach and  duodenum are unremarkable. No localized small bowel thickening or dilatation. There is phlegmon and inflammatory changes which appear largely centered upon the ascending colon with a likely culprit diverticulum (6/62, 3/57) while inflammation is present in the vicinity of the appendix, this is favored to be reactive though a small tip appendicitis cannot be fully excluded given some more focal thickening and enhancement at this level (6/68, 3/58). More distal colonic segments are unremarkable. Vascular/Lymphatic: Reactive adenopathy in the right lower quadrant. No pathologically enlarged abdominopelvic nodes. No significant vascular abnormalities. Reproductive: Retroflexed uterus.  No concerning adnexal lesions. Other: Trace fluid and phlegmon about the ascending colon and cecum, largely centered upon the suspected culprit diverticulum. Trace simple attenuation free fluid in the pelvis, likely physiologic or redistributed inflammatory fluid. No free air. No organized collection or abscess. Musculoskeletal: No acute osseous abnormality or suspicious osseous lesion. Mild degenerative changes in the lower lumbar levels IMPRESSION: 1. Inflammatory changes appear centered upon a culprit diverticulum of the ascending colon favoring acute right-sided diverticulitis. 2. Inflammation adjacent the appendix is favored to be reactive though a small tip appendicitis cannot be fully excluded given some more focal thickening and enhancement at this level. 3. Trace simple attenuation free fluid in the pelvis, likely physiologic or redistributed inflammatory fluid. 4. Mild bladder wall thickening likely related to underdistention without other gross bladder abnormality. Recommend assessment for urinary symptoms and consider urinalysis if present to exclude cystitis. Electronically Signed   By: Kreg Shropshire M.D.   On: 11/24/2019 20:27    Anti-infectives: Anti-infectives (From admission, onward)   Start      Dose/Rate Route Frequency Ordered Stop   11/26/19 0000  piperacillin-tazobactam (ZOSYN) IVPB 3.375 g  Status:  Discontinued        3.375 g 12.5 mL/hr over 240 Minutes Intravenous Every 6 hours 11/25/19 0018 11/25/19 0022   11/25/19 0200  piperacillin-tazobactam (ZOSYN) IVPB 3.375 g        3.375 g 12.5 mL/hr over 240 Minutes Intravenous Every 8 hours 11/25/19 0022     11/24/19 2200  metroNIDAZOLE (FLAGYL) tablet 500 mg  Status:  Discontinued       "And" Linked Group Details   500 mg Oral Every 8 hours 11/24/19 2101 11/25/19 0018   11/24/19 2115  cefTRIAXone (ROCEPHIN) 2 g in sodium chloride 0.9 % 100 mL IVPB       "And" Linked Group Details   2 g 200 mL/hr over 30 Minutes Intravenous  Once 11/24/19 2101 11/24/19 2253      Assessment/Plan: 58F with R sided diverticulitis  - Ok for PO abx - Diet as tolerated at this point -no plans surgery at this time -will  Need out pt c-scope as per Dr. Ewing Schlein    LOS: 1 day    Stephanie Coup [facility] 11/26/2019

## 2019-11-26 NOTE — TOC Progression Note (Signed)
Transition of Care [facility]) - Progression Note    Patient Details  Name: Leah Garcia MRN: [id number] Date of Birth: [birth year]  [facility] CM/SW Contact  Kevin Space, Adria Devon, RN Phone Number: 11/26/2019, 12:35 PM  Clinical Narrative:      Scheduled follow up appointment at [facility], placed on AVS.   Entered in [facility] with no co pay for Zofran, Flagyl, and omnicet , Mirlax not covered by [facility]. TOC pharmacy will bring meds to hospital room.  Expected Discharge Plan: Home/Self Care Barriers to Discharge: No Barriers Identified  Expected Discharge Plan and Services Expected Discharge Plan: Home/Self Care In-house Referral: Financial Counselor Discharge Planning Services: CM Consult, [facility], [facility] Program, Medication Assistance   Living arrangements for the past 2 months: Apartment Expected Discharge Date: 11/26/19               DME Arranged: N/A         HH Arranged: NA           Social Determinants of Health (SDOH) Interventions    Readmission Risk Interventions No flowsheet data found.

## 2019-11-26 NOTE — Progress Notes (Signed)
Discharge teaching complete. Meds, diet, activity, follow up appointments reviewed and all questions answered. Graciela at bedside to help with translation and to answer all questions. Copy of instructions given to patient and TOC will bring meds to bedside.

## 2019-11-26 NOTE — Progress Notes (Signed)
PHARMACY - PHYSICIAN COMMUNICATION CRITICAL VALUE ALERT - BLOOD CULTURE IDENTIFICATION (BCID)  Leah Garcia [name] is an 24 y.o. female who presented to [facility] on 11/24/2019 with a chief complaint of abdominal pain/diverticulitis  Assessment:   1/2 of blood cultures growing Staph epidermidis and Strep species.  Likely contaminant.  Name of physician (or Provider) Contacted:  Dr. Antionette Char  Current antibiotics: Zosyn  Changes to prescribed antibiotics recommended:  Likely contaminant--no changes recommended  Results for orders placed or performed during the hospital encounter of 11/24/19  Blood Culture ID Panel (Reflexed) (Collected: 11/24/2019  9:27 PM)  Result Value Ref Range   Enterococcus faecalis NOT DETECTED NOT DETECTED   Enterococcus Faecium NOT DETECTED NOT DETECTED   Listeria monocytogenes NOT DETECTED NOT DETECTED   Staphylococcus species DETECTED (A) NOT DETECTED   Staphylococcus aureus (BCID) NOT DETECTED NOT DETECTED   Staphylococcus epidermidis DETECTED (A) NOT DETECTED   Staphylococcus lugdunensis NOT DETECTED NOT DETECTED   Streptococcus species DETECTED (A) NOT DETECTED   Streptococcus agalactiae NOT DETECTED NOT DETECTED   Streptococcus pneumoniae NOT DETECTED NOT DETECTED   Streptococcus pyogenes NOT DETECTED NOT DETECTED   A.calcoaceticus-baumannii NOT DETECTED NOT DETECTED   Bacteroides fragilis NOT DETECTED NOT DETECTED   Enterobacterales NOT DETECTED NOT DETECTED   Enterobacter cloacae complex NOT DETECTED NOT DETECTED   Escherichia coli NOT DETECTED NOT DETECTED   Klebsiella aerogenes NOT DETECTED NOT DETECTED   Klebsiella oxytoca NOT DETECTED NOT DETECTED   Klebsiella pneumoniae NOT DETECTED NOT DETECTED   Proteus species NOT DETECTED NOT DETECTED   Salmonella species NOT DETECTED NOT DETECTED   Serratia marcescens NOT DETECTED NOT DETECTED   Haemophilus influenzae NOT DETECTED NOT DETECTED   Neisseria meningitidis NOT DETECTED NOT DETECTED    Pseudomonas aeruginosa NOT DETECTED NOT DETECTED   Stenotrophomonas maltophilia NOT DETECTED NOT DETECTED   Candida albicans NOT DETECTED NOT DETECTED   Candida auris NOT DETECTED NOT DETECTED   Candida glabrata NOT DETECTED NOT DETECTED   Candida krusei NOT DETECTED NOT DETECTED   Candida parapsilosis NOT DETECTED NOT DETECTED   Candida tropicalis NOT DETECTED NOT DETECTED   Cryptococcus neoformans/gattii NOT DETECTED NOT DETECTED   Methicillin resistance mecA/C NOT DETECTED NOT DETECTED    Eddie Candle 11/26/2019  3:10 AM

## 2019-11-26 NOTE — Progress Notes (Signed)
Patient discharged home via wheelchair.  

## 2019-11-28 LAB — CULTURE, BLOOD (ROUTINE X 2): Special Requests: ADEQUATE

## 2019-11-29 LAB — CULTURE, BLOOD (ROUTINE X 2)
Culture: NO GROWTH
Special Requests: ADEQUATE

## 2019-12-20 ENCOUNTER — Inpatient Hospital Stay: Payer: Self-pay | Admitting: Physician Assistant

## 2019-12-20 ENCOUNTER — Other Ambulatory Visit: Payer: Self-pay

## 2019-12-20 ENCOUNTER — Ambulatory Visit: Payer: Self-pay | Attending: Family Medicine | Admitting: Family Medicine

## 2019-12-20 ENCOUNTER — Encounter: Payer: Self-pay | Admitting: Family Medicine

## 2019-12-20 VITALS — BP 105/70 | HR 74 | Temp 98.5°F | Ht 65.5 in | Wt 233.0 lb

## 2019-12-20 DIAGNOSIS — K5732 Diverticulitis of large intestine without perforation or abscess without bleeding: Secondary | ICD-10-CM

## 2019-12-20 NOTE — Patient Instructions (Signed)
Diverticulitis Diverticulitis  La diverticulitis ocurre cuando pequeos bolsillos que se han formado en el intestino grueso (colon) se infectan o se inflaman. Esto produce dolor de estmago y heces lquidas (diarrea). Estas bolsas en el colon se denominan divertculos. Se forman en las personas que tienen una afeccin llamada diverticulitis. Siga estas indicaciones en su casa: Medicamentos  Tome los medicamentos de venta libre y los recetados solamente como se lo haya indicado el mdico. Estos incluyen los siguientes: ? Antibiticos. ? Analgsicos. ? Pastillas de fibra. ? Probiticos. ? Laxantes.  No conduzca ni use maquinaria pesada mientras toma analgsicos recetados.  Si le recetaron un antibitico, tmelo como se lo hayan indicado. No deje de tomarlos aunque se sienta mejor. Instrucciones generales   Siga la dieta como se lo haya indicado el mdico.  Cuando se sienta mejor, el mdico puede indicarle que cambie la dieta. Tal vez necesite ingerir gran cantidad de fibra. La fibra facilita la evacuacin intestinal (defecacin). Entre los alimentos saludables con fibra, se incluyen los siguientes: ? Frutos rojos. ? Frijoles. ? Lentejas. ? Verduras de hoja verde.  Haga ejercicios 3 o ms veces por semana. Hgalos durante 30 minutos cada vez. Ejerctese lo suficiente como para transpirar y acelerar los latidos cardacos.  Concurra a todas las visitas de control como se lo hayan indicado. Esto es importante. Puede que tenga que someterse a un examen del intestino grueso. Esto se denomina colonoscopia. Comunquese con un mdico si:  El dolor no mejora.  Le cuesta mucho comer o beber.  No defeca como lo hace normalmente. Solicite ayuda de inmediato si:  El dolor empeora.  Los problemas no mejoran.  Los problemas empeoran muy rpidamente.  Tiene fiebre.  Devuelve (vomita) ms de una vez.  Sus heces tienen las siguientes caractersticas: ? Contienen sangre. ? Son de color  negro. ? Son alquitranadas. Resumen  La diverticulitis ocurre cuando pequeos bolsillos que se han formado en el intestino grueso (colon) se infectan o se inflaman.  Tome los medicamentos solamente como se lo haya indicado el mdico.  Siga la dieta como se lo haya indicado el mdico. Esta informacin no tiene como fin reemplazar el consejo del mdico. Asegrese de hacerle al mdico cualquier pregunta que tenga. Document Revised: 06/24/2016 Document Reviewed: 06/24/2016 Elsevier Patient Education  2020 Elsevier Inc.  

## 2019-12-20 NOTE — Progress Notes (Signed)
Subjective:  Patient ID: Leah Garcia, female    DOB: 1995/09/13  Age: 24 y.o. MRN: [id number]  CC: Hospitalization Follow-up   HPI Akyia Borelli [name] is a 24 year old female was hospitalized at [facility] from 11/24/2019 through 11/26/2019 for acute diverticulitis with SIRS after she had presented with right lower quadrant pain and CT abdomen and pelvis revealed right-sided diverticulitis. She was treated with IV antibiotics with improvement in symptoms and subsequently discharged on metronidazole and kept in negative follow-up with GI.  She had told the Nurse she had abdominal pain but denies this and tells me she had abdominal pain a week ago but has none now. She has no additional concerns.  No past medical history on file.  No past surgical history on file.  Family History  Problem Relation Age of Onset  . Heart disease Neg Hx   . Cancer Neg Hx     No Known Allergies  Outpatient Medications Prior to Visit  Medication Sig Dispense Refill  . etonogestrel (NEXPLANON) 68 MG IMPL implant 1 each by Subdermal route once. Implanted 2019    . Acetaminophen (TYLENOL PO) Take 2 tablets by mouth every 6 (six) hours as needed (pain/headache). (Patient not taking: Reported on 12/20/2019)    . ondansetron (ZOFRAN) 4 MG tablet Take 1 tablet (4 mg total) by mouth every 6 (six) hours as needed for nausea. (Patient not taking: Reported on 12/20/2019) 20 tablet 0  . polyethylene glycol (MIRALAX / GLYCOLAX) 17 g packet Take 17 g by mouth daily as needed for mild constipation. (Patient not taking: Reported on 12/20/2019) 14 each 0   No facility-administered medications prior to visit.     ROS Review of Systems  Constitutional: Negative for activity change, appetite change and fatigue.  HENT: Negative for congestion, sinus pressure and sore throat.   Eyes: Negative for visual disturbance.  Respiratory: Negative for cough, chest tightness, shortness of  breath and wheezing.   Cardiovascular: Negative for chest pain and palpitations.  Gastrointestinal: Negative for abdominal distention, abdominal pain and constipation.  Endocrine: Negative for polydipsia.  Genitourinary: Negative for dysuria and frequency.  Musculoskeletal: Negative for arthralgias and back pain.  Skin: Negative for rash.  Neurological: Negative for tremors, light-headedness and numbness.  Hematological: Does not bruise/bleed easily.  Psychiatric/Behavioral: Negative for agitation and behavioral problems.    Objective:  BP 105/70   Pulse 74   Temp 98.5 F (36.9 C) (Oral)   Ht 5' 5.5" (1.664 m)   Wt 233 lb (105.7 kg)   SpO2 99%   BMI 38.18 kg/m   BP/Weight 12/20/2019 11/26/2019 11/24/2019  Systolic BP 105 101 -  Diastolic BP 70 60 -  Wt. (Lbs) 233 - 180  BMI 38.18 - 29.95      Physical Exam Constitutional:      Appearance: She is well-developed.  Neck:     Vascular: No JVD.  Cardiovascular:     Rate and Rhythm: Normal rate.     Heart sounds: Normal heart sounds. No murmur heard.   Pulmonary:     Effort: Pulmonary effort is normal.     Breath sounds: Normal breath sounds. No wheezing or rales.  Chest:     Chest wall: No tenderness.  Abdominal:     General: Bowel sounds are normal. There is no distension.     Palpations: Abdomen is soft. There is no mass.     Tenderness: There is no abdominal tenderness.  Musculoskeletal:  General: Normal range of motion.     Right lower leg: No edema.     Left lower leg: No edema.  Neurological:     Mental Status: She is alert and oriented to person, place, and time.  Psychiatric:        Mood and Affect: Mood normal.     CMP Latest Ref Rng & Units 11/25/2019 11/24/2019  Glucose 70 - 99 mg/dL 794(I) 016(P)  BUN 6 - 20 mg/dL 5(L) 7  Creatinine 5.37 - 1.00 mg/dL 4.82 7.07  Sodium 867 - 145 mmol/L 135 136  Potassium 3.5 - 5.1 mmol/L 3.5 4.0  Chloride 98 - 111 mmol/L 103 102  CO2 22 - 32 mmol/L 24  25  Calcium 8.9 - 10.3 mg/dL 8.3(L) 8.8(L)  Total Protein 6.5 - 8.1 g/dL 6.3(L) 7.5  Total Bilirubin 0.3 - 1.2 mg/dL 1.1 0.6  Alkaline Phos 38 - 126 U/L 69 77  AST 15 - 41 U/L 12(L) 16  ALT 0 - 44 U/L 13 15    Lipid Panel  No results found for: CHOL, TRIG, HDL, CHOLHDL, VLDL, LDLCALC, LDLDIRECT  CBC    Component Value Date/Time   WBC 8.8 11/26/2019 1000   RBC 3.95 11/26/2019 1000   HGB 11.4 (L) 11/26/2019 1000   HCT 35.1 (L) 11/26/2019 1000   PLT 219 11/26/2019 1000   MCV 88.9 11/26/2019 1000   MCH 28.9 11/26/2019 1000   MCHC 32.5 11/26/2019 1000   RDW 13.4 11/26/2019 1000   LYMPHSABS 1.8 11/25/2019 0536   MONOABS 1.0 11/25/2019 0536   EOSABS 0.1 11/25/2019 0536   BASOSABS 0.0 11/25/2019 0536    No results found for: HGBA1C  Assessment & Plan:  1. Diverticulitis of colon Resolved Advised to increase fiber intake Discharge notes had recommended outpatient GI follow-up for colonoscopy hence referral placed. - Ambulatory referral to Gastroenterology    No orders of the defined types were placed in this encounter.   Follow-up: Return if symptoms worsen or fail to improve.       Hoy Register, MD, FAAFP. [facility] [place] [facility] [place], [place] [phone]   12/20/2019, 12:37 PM

## 2019-12-20 NOTE — Progress Notes (Signed)
Had pain in her right side of abdomen.

## 2020-08-05 ENCOUNTER — Other Ambulatory Visit: Payer: Self-pay

## 2020-08-05 ENCOUNTER — Emergency Department (HOSPITAL_COMMUNITY): Payer: Self-pay

## 2020-08-05 ENCOUNTER — Emergency Department (HOSPITAL_COMMUNITY)
Admission: EM | Admit: 2020-08-05 | Discharge: 2020-08-07 | Disposition: A | Discharge status: Home / Self Care | Payer: Self-pay | Attending: Emergency Medicine | Admitting: Emergency Medicine

## 2020-08-05 DIAGNOSIS — Y9301 Activity, walking, marching and hiking: Secondary | ICD-10-CM | POA: Insufficient documentation

## 2020-08-05 DIAGNOSIS — X501XXA Overexertion from prolonged static or awkward postures, initial encounter: Secondary | ICD-10-CM | POA: Insufficient documentation

## 2020-08-05 DIAGNOSIS — S8001XA Contusion of right knee, initial encounter: Secondary | ICD-10-CM | POA: Insufficient documentation

## 2020-08-05 DIAGNOSIS — W109XXA Fall (on) (from) unspecified stairs and steps, initial encounter: Secondary | ICD-10-CM | POA: Insufficient documentation

## 2020-08-05 DIAGNOSIS — S93402A Sprain of unspecified ligament of left ankle, initial encounter: Secondary | ICD-10-CM | POA: Insufficient documentation

## 2020-08-05 MED ORDER — IBUPROFEN 400 MG PO TABS
400.0000 mg | ORAL_TABLET | Freq: Once | ORAL | Status: AC
  Administered 2020-08-05: 400 mg via ORAL
  Filled 2020-08-05: qty 1

## 2020-08-05 MED ORDER — ACETAMINOPHEN 500 MG PO TABS
1000.0000 mg | ORAL_TABLET | Freq: Once | ORAL | Status: AC
  Administered 2020-08-05: 1000 mg via ORAL
  Filled 2020-08-05: qty 2

## 2020-08-05 NOTE — ED Provider Notes (Signed)
Emergency Medicine Provider Triage Evaluation Note  Leah Garcia , a 25 y.o. female  was evaluated in triage.  Pt complains of a fall. She is c/o right knee pain and left ankle pain. Denies head trauma or loc.  Review of Systems  Positive: Right knee pain, left ankle pain Negative: Head trauma, loc  Physical Exam  BP 114/78 (BP Location: Right Arm)   Pulse 89   Temp 99.1 F (37.3 C) (Oral)   Resp 20   Ht 5\' 5"  (1.651 m)   Wt 105.7 kg   SpO2 91%   BMI 38.77 kg/m  Gen:   Awake, no distress   Resp:  Normal effort  MSK:   Moves extremities without difficulty  Other:  Ttp to the right knee and left lateral malleolus. Abrasion noted to the right kene  Medical Decision Making  Medically screening exam initiated at 8:44 PM.  Appropriate orders placed.  Leah Garcia was informed that the remainder of the evaluation will be completed by another provider, this initial triage assessment does not replace that evaluation, and the importance of remaining in the ED until their evaluation is complete.     Leah Garcia 08/05/20 2045    2046, MD 08/05/20 2141

## 2020-08-05 NOTE — ED Notes (Signed)
Secretary to page ortho tech

## 2020-08-05 NOTE — Discharge Instructions (Addendum)
No broken bones today.  It is a bad sprain.  Use the brace and the crutches for the next 1 to 2 weeks if you are having lots of pain.  Elevate it, ice it and use 2 ibuprofen and 2 Tylenol together every 6 hours for pain.

## 2020-08-05 NOTE — ED Triage Notes (Signed)
Pt states having mechanical fall while going down the stairs. Fell onto right knee and twisted left ankle. Left ankle tender on palpation.

## 2020-08-05 NOTE — ED Provider Notes (Signed)
[facility] EMERGENCY DEPARTMENT Provider Note   CSN: [id number] Arrival date & time: 08/05/20  2031     History Chief Complaint  Patient presents with   Leah Garcia Leah Garcia Leah Garcia is a 25 y.o. female.  Patient is a 25 year old female with history of diverticulitis who presents today for with pain in the right knee and the left ankle.  She was walking up the stairs when she tripped and her foot twisted.  She fell and twisted her ankle.  Since this occurred she has not been able to bear weight because it has been too painful.  She has had swelling of the left ankle and abrasion to the right knee.  The pain is worse in the right knee with bending but denies any right ankle pain.  She did not hit her head or lose consciousness.  The history is provided by the patient.  Fall This is a new problem. The current episode started 3 to 5 hours ago. The problem occurs constantly. The problem has not changed since onset.Associated symptoms comments: Pain in the right knee and the left ankle. The symptoms are aggravated by walking, bending and twisting. The symptoms are relieved by rest. She has tried nothing for the symptoms. The treatment provided no relief.      No past medical history on file.  Patient Active Problem List   Diagnosis Date Noted   SIRS (systemic inflammatory response syndrome) (HCC) 11/25/2019   Diverticulitis 11/25/2019   Diverticulitis of colon 11/24/2019    No past surgical history on file.   OB History   No obstetric history on file.     Family History  Problem Relation Age of Onset   Heart disease Neg Hx    Cancer Neg Hx     Social History   Tobacco Use   Smoking status: Never   Smokeless tobacco: Never  Vaping Use   Vaping Use: Never used  Substance Use Topics   Alcohol use: Yes    Alcohol/week: 5.0 standard drinks    Types: 5 Cans of beer per week    Comment: 5 beers weekly, mostly on the weekend   Drug use: Never     Home Medications Prior to Admission medications   Medication Sig Start Date End Date Taking? Authorizing Provider  Acetaminophen (TYLENOL PO) Take 2 tablets by mouth every 6 (six) hours as needed (pain/headache). Patient not taking: Reported on 12/20/2019    [provider]  cefdinir (OMNICEF) 300 MG capsule TAKE 1 CAPSULE (300 MG TOTAL) BY MOUTH TWO TIMES DAILY FOR 5 DAYS. 11/26/19 11/25/20  Alessandra Bevels, MD  etonogestrel (NEXPLANON) 68 MG IMPL implant 1 each by Subdermal route once. Implanted 2019    [provider]  metroNIDAZOLE (FLAGYL) 500 MG tablet TAKE 1 TABLET (500 MG TOTAL) BY MOUTH THREE TIMES DAILY FOR 5 DAYS. 11/26/19 11/25/20  Alessandra Bevels, MD  ondansetron (ZOFRAN) 4 MG tablet TAKE 1 TABLET (4 MG TOTAL) BY MOUTH EVERY SIX HOURS AS NEEDED FOR NAUSEA. Patient not taking: Reported on 12/20/2019 11/26/19 11/25/20  Alessandra Bevels, MD  polyethylene glycol (MIRALAX / GLYCOLAX) 17 g packet Take 17 g by mouth daily as needed for mild constipation. Patient not taking: Reported on 12/20/2019 11/26/19   Alessandra Bevels, MD    Allergies    Patient has no known allergies.  Review of Systems   Review of Systems  All other systems reviewed and are negative.  Physical Exam Updated Vital Signs  BP 114/78 (BP Location: Right Arm)   Pulse 89   Temp 99.1 F (37.3 C) (Oral)   Resp 20   Ht 5\' 5"  (1.651 m)   Wt 105.7 kg   SpO2 91%   BMI 38.77 kg/m   Physical Exam Vitals and nursing note reviewed.  Constitutional:      General: She is not in acute distress.    Appearance: Normal appearance.  HENT:     Head: Normocephalic.  Eyes:     Pupils: Pupils are equal, round, and reactive to light.  Cardiovascular:     Rate and Rhythm: Normal rate.     Pulses: Normal pulses.  Pulmonary:     Effort: Pulmonary effort is normal.  Musculoskeletal:        General: Tenderness present.     Cervical back: Normal range of motion and neck supple.     Right  knee: Swelling present. Normal range of motion. Tenderness present over the patellar tendon.     Left ankle: Swelling present. Tenderness present over the lateral malleolus and base of 5th metatarsal. No proximal fibula tenderness. Decreased range of motion.       Legs:  Skin:    General: Skin is warm and dry.  Neurological:     Mental Status: She is alert. Mental status is at baseline.  Psychiatric:        Mood and Affect: Mood normal.    ED Results / Procedures / Treatments   Labs (all labs ordered are listed, but only abnormal results are displayed) Labs Reviewed - No data to display  EKG None  Radiology DG Ankle Complete Left  Result Date: 08/05/2020 CLINICAL DATA:  Left ankle swelling after fall today EXAM: LEFT ANKLE COMPLETE - 3+ VIEW COMPARISON:  None. FINDINGS: Frontal, oblique, lateral views of the left ankle are obtained. No fracture, subluxation, or dislocation. The joint spaces are well preserved. There is diffuse soft tissue swelling, greatest laterally. IMPRESSION: 1. Soft tissue swelling.  No acute displaced fracture. Electronically Signed   By: 10/05/2020 M.D.   On: 08/05/2020 21:24   DG Knee Complete 4 Views Right  Result Date: 08/05/2020 CLINICAL DATA:  Anterior right knee laceration after fall today EXAM: RIGHT KNEE - COMPLETE 4+ VIEW COMPARISON:  None. FINDINGS: Frontal, bilateral oblique, lateral views of the right knee are obtained. No fracture, subluxation, or dislocation. Joint spaces are well preserved. No joint effusion. Soft tissues are unremarkable. IMPRESSION: 1. Unremarkable right knee. Electronically Signed   By: 10/05/2020 M.D.   On: 08/05/2020 21:24    Procedures Procedures   Medications Ordered in ED Medications  acetaminophen (TYLENOL) tablet 1,000 mg (has no administration in time range)  ibuprofen (ADVIL) tablet 400 mg (has no administration in time range)    ED Course  I have reviewed the triage vital signs and the nursing  notes.  Pertinent labs & imaging results that were available during my care of the patient were reviewed by me and considered in my medical decision making (see chart for details).    MDM Rules/Calculators/A&P                           Patient presenting today after a fall down the stairs with injury to the right knee and the left ankle.  Significant swelling noted at the left ankle and she has been unable to bear weight.  She denies any head injury or loss of consciousness.  Plain films are negative for acute fracture.  Will place an Aircast and give crutches for severe ankle sprain.  Patient also given NSAIDs and Tylenol.  Recommended that patient elevate and ice.  She was given orthopedic follow-up.  MDM   Amount and/or Complexity of Data Reviewed Tests in the radiology section of CPT: ordered and reviewed Independent visualization of images, tracings, or specimens: yes    Final Clinical Impression(s) / ED Diagnoses Final diagnoses:  Sprain of left ankle, unspecified ligament, initial encounter  Contusion of right knee, initial encounter    Rx / DC Orders ED Discharge Orders     None        Gwyneth Sprout, MD 08/05/20 2235

## 2020-08-06 NOTE — Progress Notes (Signed)
Orthopedic Tech Progress Note Patient Details:  Leah Garcia 01-30-1995 [id number]  Ortho Devices Type of Ortho Device: Crutches, Ankle Air splint Ortho Device/Splint Location: lle Ortho Device/Splint Interventions: Ordered, Application, Adjustment   Post Interventions Patient Tolerated: Well Instructions Provided: Care of device, Adjustment of device  Trinna Post 08/06/2020, 12:37 AM

## 2020-10-13 ENCOUNTER — Ambulatory Visit (INDEPENDENT_AMBULATORY_CARE_PROVIDER_SITE_OTHER): Payer: Self-pay | Admitting: *Deleted

## 2020-10-13 ENCOUNTER — Other Ambulatory Visit: Payer: Self-pay

## 2020-10-13 VITALS — BP 111/69 | HR 77 | Temp 97.6°F | Wt 238.8 lb

## 2020-10-13 DIAGNOSIS — O99210 Obesity complicating pregnancy, unspecified trimester: Secondary | ICD-10-CM

## 2020-10-13 DIAGNOSIS — O0990 Supervision of high risk pregnancy, unspecified, unspecified trimester: Secondary | ICD-10-CM | POA: Insufficient documentation

## 2020-10-13 DIAGNOSIS — Z3480 Encounter for supervision of other normal pregnancy, unspecified trimester: Secondary | ICD-10-CM

## 2020-10-13 DIAGNOSIS — Z3A14 14 weeks gestation of pregnancy: Secondary | ICD-10-CM

## 2020-10-13 NOTE — Progress Notes (Signed)
   Location: [facility] Patient: clinic Provider: clinic Interpreter: Donata Clay ID [phone]  PRENATAL INTAKE SUMMARY  [name] presents today New OB Nurse Interview.  OB History     Gravida  2   Para      Term      Preterm      AB      Living  1      SAB      IAB      Ectopic      Multiple      Live Births  1          I have reviewed the patient's medical, obstetrical, social, and family histories, medications, and available lab results.  SUBJECTIVE She has no unusual complaints  OBJECTIVE Initial Nurse interview for history/labs (New OB).  [facility]  EDD: 04/08/2021 by LMP GA: [redacted]w[redacted]d G2P1001 FHT: unable to assess patient reported she is only 9 weeks per ultrasound from [facility]  GENERAL APPEARANCE: alert, well appearing, in no apparent distress, oriented to person, place and time, overweight   ASSESSMENT Normal pregnancy  PLAN Prenatal care:  [facility] Pnl/HIV/Hep C OB Urine Culture GC/CT at next visit with Judeth Horn, NP HgbEval/SMA/CF ([facility])-at next visit [facility] at next visit A1C ROL for ultrasound report from [facility] done 10/03/20   Follow Up Instructions:   I discussed the assessment and treatment plan with the patient. The patient was provided an opportunity to ask questions and all were answered. The patient agreed with the plan and demonstrated an understanding of the instructions.   The patient was advised to call back or seek an in-person evaluation if the symptoms worsen or if the condition fails to improve as anticipated.  I provided 35 minutes of  face-to-face time during this encounter.  Clovis Pu, RN

## 2020-10-13 NOTE — Patient Instructions (Signed)
° °Genetic Screening Results Information: °You are having genetic testing called [facility] today.  It will take approximately 2 weeks before the results are available.  To get your results, you need Internet access to a web browser to search [facility]/MyChart (the direct app on your phone will not give you these results).  Then select Lab Scanned and click on the blue hyper link that says View Image to see your Panorama results.  You can also use the directions on the purple card given to look up your results directly on the [facility] website.  ° °Please remember that if you are not able to keep your appointment to call the office and reschedule, or cancel. If you no-show or cancel with less than 24 hour notice we will charge you, not your insurance a $50 fee.  ° °AREA PEDIATRIC/FAMILY PRACTICE PHYSICIANS ° °[facility] °[address] °[address] °[place], [place] [address] °Phone - [phone]   Fax - [phone] ° °[facility] °[address] °[place], [place]  [address] °Phone - [phone]   Fax - [phone] ° °[facility] °[address] °[place], [place]  [address] °Phone - [phone]   Fax - [phone] ° °[facility] °[address] °[place], [place]  [address] °Phone - [phone]   Fax - [phone] ° °[facility] °[address] °[place], [place]  [address] °Phone - [phone]   Fax - [phone] ° °[facility] °[address] °[place], [place]  [address] °Phone - [phone]   Fax - [phone] ° °[facility] °[address] °[place], [place]  [address] °Phone - [phone]   Fax - [phone] ° °[facility] °[address] °[place], [place]  [address] °Phone - [phone]   Fax - [phone] ° °[facility] °[address] °[place], [place]  [address] °Phone - [phone]   Fax -  [phone] °[facility] °[address] °[place], [place]  [address] °Phone - [phone]   Fax - [phone] ° °[facility] °[address] °[place], [place]  [address] °Phone - [phone]   Fax - [phone] ° °[facility] °[address] °[place], [place]  [address] °Phone - [phone]   Fax - [phone] ° °[facility] °[address] °[place], [place]  [address] °Phone - [phone]   Fax - [phone] ° °[facility] °[address] °[place], [place]  [address] °Phone - [phone] ° °[facility] °[address] °[place], [place]  [address] °Phone - [phone]   Fax - [phone] ° °[facility] °[address] °[place], [place]  [address] °Phone - [phone]   Fax - [phone] ° °[facility] °[address] °[place], [place]  [address] °Phone - [phone]   Fax - [phone] ° °[facility] °[address] °[place], [place]  [address] °Phone - [phone]   Fax - [phone] ° ° °[facility] °[address] °[place], [place]  [address] °Phone - [phone]   Fax - [phone] ° °[facility] °[address] °[place], [place]  [address] °Phone - [phone]   Fax - [phone] ° °[facility] °[address] °[place], [place]  [address] °Phone - [phone]   Fax - [phone] ° °[facility] °[address] °[place], [place]  [address] °Phone - [phone]   Fax - [phone] ° °[facility] °[address] °, [place]  [address] °Phone - [phone]   Fax - [phone] °[facility] °[address] °[place], [place]  [address] °Phone - [phone]   Fax - [phone] ° °[facility] °[address] °[place], [place]  [address] °Phone - [phone]   Fax - [phone] ° °  [facility] °[address] °[place], [place]  [address] °Phone - [phone]   Fax - [phone] °  °

## 2020-10-14 LAB — OBSTETRIC PANEL, INCLUDING HIV
Antibody Screen: NEGATIVE
Basophils Absolute: 0 10*3/uL (ref 0.0–0.2)
Basos: 0 %
EOS (ABSOLUTE): 0.3 10*3/uL (ref 0.0–0.4)
Eos: 3 %
HIV Screen 4th Generation wRfx: NONREACTIVE
Hematocrit: 34.9 % (ref 34.0–46.6)
Hemoglobin: 11.7 g/dL (ref 11.1–15.9)
Hepatitis B Surface Ag: NEGATIVE
Immature Grans (Abs): 0 10*3/uL (ref 0.0–0.1)
Immature Granulocytes: 0 %
Lymphocytes Absolute: 2.2 10*3/uL (ref 0.7–3.1)
Lymphs: 24 %
MCH: 28.7 pg (ref 26.6–33.0)
MCHC: 33.5 g/dL (ref 31.5–35.7)
MCV: 86 fL (ref 79–97)
Monocytes Absolute: 0.6 10*3/uL (ref 0.1–0.9)
Monocytes: 7 %
Neutrophils Absolute: 6.1 10*3/uL (ref 1.4–7.0)
Neutrophils: 66 %
Platelets: 228 10*3/uL (ref 150–450)
RBC: 4.07 x10E6/uL (ref 3.77–5.28)
RDW: 13.1 % (ref 11.7–15.4)
RPR Ser Ql: NONREACTIVE
Rh Factor: POSITIVE
Rubella Antibodies, IGG: 2.89 index (ref >0.99)
WBC: 9.3 10*3/uL (ref 3.4–10.8)

## 2020-10-14 LAB — HEMOGLOBIN A1C
Est. average glucose Bld gHb Est-mCnc: 126 mg/dL
Hgb A1c MFr Bld: 6 % — ABNORMAL HIGH (ref 4.8–5.6)

## 2020-10-14 LAB — HEPATITIS C ANTIBODY: Hep C Virus Ab: <0.1 s/co ratio (ref 0.0–0.9)

## 2020-10-15 LAB — URINE CULTURE, OB REFLEX

## 2020-10-15 LAB — CULTURE, OB URINE

## 2020-10-31 ENCOUNTER — Other Ambulatory Visit (HOSPITAL_COMMUNITY)
Admission: RE | Admit: 2020-10-31 | Discharge: 2020-10-31 | Disposition: A | Discharge status: Home / Self Care | Payer: Self-pay | Source: Ambulatory Visit | Attending: Student | Admitting: Student

## 2020-10-31 ENCOUNTER — Ambulatory Visit (INDEPENDENT_AMBULATORY_CARE_PROVIDER_SITE_OTHER): Payer: Self-pay | Admitting: Student

## 2020-10-31 ENCOUNTER — Other Ambulatory Visit: Payer: Self-pay

## 2020-10-31 ENCOUNTER — Encounter: Payer: Self-pay | Admitting: Student

## 2020-10-31 VITALS — BP 92/61 | HR 96 | Temp 98.2°F | Wt 239.2 lb

## 2020-10-31 DIAGNOSIS — Z3480 Encounter for supervision of other normal pregnancy, unspecified trimester: Secondary | ICD-10-CM

## 2020-10-31 DIAGNOSIS — Z3A12 12 weeks gestation of pregnancy: Secondary | ICD-10-CM | POA: Insufficient documentation

## 2020-10-31 DIAGNOSIS — O99210 Obesity complicating pregnancy, unspecified trimester: Secondary | ICD-10-CM | POA: Insufficient documentation

## 2020-10-31 DIAGNOSIS — N898 Other specified noninflammatory disorders of vagina: Secondary | ICD-10-CM

## 2020-10-31 DIAGNOSIS — O26899 Other specified pregnancy related conditions, unspecified trimester: Secondary | ICD-10-CM

## 2020-10-31 DIAGNOSIS — R7303 Prediabetes: Secondary | ICD-10-CM | POA: Insufficient documentation

## 2020-10-31 DIAGNOSIS — O219 Vomiting of pregnancy, unspecified: Secondary | ICD-10-CM

## 2020-10-31 HISTORY — DX: Prediabetes: R73.03

## 2020-10-31 MED ORDER — ASPIRIN EC 81 MG PO TBEC: 81.0000 mg | DELAYED_RELEASE_TABLET | Freq: Every day | ORAL | 2 refills | Status: DC

## 2020-10-31 MED ORDER — METOCLOPRAMIDE HCL 10 MG PO TABS: 10.0000 mg | ORAL_TABLET | Freq: Four times a day (QID) | ORAL | 1 refills | Status: DC

## 2020-10-31 NOTE — Progress Notes (Signed)
Subjective:   Leah Garcia is a 25 y.o. G2P1001 at [redacted]w[redacted]d by early ultrasound being seen today for her first obstetrical visit.  Her obstetrical history is significant for obesity and prediabetes . Patient does intend to breast feed. Pregnancy history fully reviewed. Is unsure when her last pap smear was.  Denies complications with her first pregnancy other than being told she was anemic. Has a term vaginal delivery in [place].   Reports daily nausea & vomiting since finding out she was pregnant. Has tried no interventions & is interested in prescription antiemetic.  For over a week she has noticed an increase in vaginal discharge. Varies from white to clear & at times is watery vs mucoid. Occasionally feels some vaginal burning but otherwise denies itching. No bleeding.   HISTORY: OB History  Gravida Para Term Preterm AB Living  $Remov'2 1 1 'FbmQVG$ 0 0 1  SAB IAB Ectopic Multiple Live Births  0 0 0 0 1    # Outcome Date GA Lbr Len/2nd Weight Sex Delivery Anes PTL Lv  2 Current           1 Term 08/01/16   8 lb 5 oz (3.771 kg) F Vag-Spont EPI N LIV   Past Medical History:  Diagnosis Date   Medical history non-contributory    Past Surgical History:  Procedure Laterality Date   NO PAST SURGERIES     Family History  Problem Relation Age of Onset   Heart disease Neg Hx    Cancer Neg Hx    Diabetes Neg Hx    Hypertension Neg Hx    Social History   Tobacco Use   Smoking status: Never    Passive exposure: Never   Smokeless tobacco: Never  Vaping Use   Vaping Use: Never used  Substance Use Topics   Alcohol use: Not Currently    Alcohol/week: 5.0 standard drinks    Types: 5 Cans of beer per week    Comment: 5 beers weekly, mostly on the weekend   Drug use: Never   No Known Allergies Current Outpatient Medications on File Prior to Visit  Medication Sig Dispense Refill   Acetaminophen (TYLENOL PO) Take 2 tablets by mouth every 6 (six) hours as needed (pain/headache).      Prenatal Vit-Fe Fumarate-FA (PRENATAL VITAMIN PO) Take 1 tablet by mouth daily.     No current facility-administered medications on file prior to visit.     Indications for ASA therapy (per uptodate) One of the following: Previous pregnancy with preeclampsia, especially early onset and with an adverse outcome No Multifetal gestation No Chronic hypertension No Type 1 or 2 diabetes mellitus No - prediabetes Chronic kidney disease No Autoimmune disease (antiphospholipid syndrome, systemic lupus erythematosus) No  Two or more of the following: Nulliparity No Obesity (body mass index >30 kg/m2) Yes Family history of preeclampsia in mother or sister No Age ?35 years No Sociodemographic characteristics (African American race, low socioeconomic level) Yes Personal risk factors (eg, previous pregnancy with low birth weight or small for gestational age infant, previous adverse pregnancy outcome [eg, stillbirth], interval >10 years between pregnancies) No  Indications for early GDM screening - BMI >24 (>22 in Asian Americans) AND one of the following:  First-degree relative with diabetes No BMI >30kg/m2 Yes Age > 25 No Previous birth of an infant weighing ?4000 g No Gestational diabetes mellitus in a previous pregnancy No Glycated hemoglobin ?5.7 percent (39 mmol/mol), impaired glucose tolerance, or impaired fasting glucose  on previous testing Yes High-risk race/ethnicity (eg, African American, Latino, Native American, Asian American, Pacific Islander) Yes Previous stillbirth of unknown cause No Maternal birthweight > 9 lbs No History of cardiovascular disease No Hypertension or on therapy for hypertension No High-density lipoprotein cholesterol level <35 mg/dL (0.90 mmol/L) and/or a triglyceride level >250 mg/dL (2.82 mmol/L) No Polycystic ovary syndrome No Physical inactivity No Other clinical condition associated with insulin resistance (eg, severe obesity, acanthosis nigricans)  No Current use of glucocorticoids No   Early screening tests: FBS, A1C, Random CBG, glucose challenge     Exam   Vitals:   10/31/20 0839  BP: 92/61  Pulse: 96  Temp: 98.2 F (36.8 C)  Weight: 239 lb 3.2 oz (108.5 kg)   Fetal Heart Rate (bpm): 150  System: General: well-developed, well-nourished female in no acute distress   Skin: normal coloration and turgor, no rashes   Neurologic: oriented, normal, negative, normal mood   Extremities: normal strength, tone, and muscle mass, ROM of all joints is normal   HEENT PERRLA, extraocular movement intact and sclera clear, anicteric   Mouth/Teeth mucous membranes moist, pharynx normal without lesions and dental hygiene good   Neck supple and no masses   Cardiovascular: regular rate and rhythm   Respiratory:  no respiratory distress, normal breath sounds   Abdomen: soft, non-tender; bowel sounds normal; no masses,  no organomegaly     Assessment:   Pregnancy: G2P1001 Patient Active Problem List   Diagnosis Date Noted   Prediabetes 10/31/2020   Supervision of other normal pregnancy, antepartum 10/13/2020   Obesity affecting pregnancy, antepartum 10/13/2020   SIRS (systemic inflammatory response syndrome) ([place]) 11/25/2019   Diverticulitis 11/25/2019   Diverticulitis of colon 11/24/2019     Plan:  1. Supervision of other normal pregnancy, antepartum  - Genetic Screening - Comp Met (CMET) - Protein / creatinine ratio, urine - Cervicovaginal ancillary only  2. Obesity affecting pregnancy, antepartum -Will start BASA & referred to diabetes educator. Discussed recommended weight gain.   - Genetic Screening - Comp Met (CMET) - Protein / creatinine ratio, urine - Referral to Nutrition and Diabetes Services - Cervicovaginal ancillary only - aspirin EC 81 MG tablet; Take 1 tablet (81 mg total) by mouth daily. Take after 12 weeks for prevention of preeclampsia later in pregnancy  Dispense: 300 tablet; Refill: 2  3.  Prediabetes -A1C at new ob is 6.0. Will start BASA & refer to diabetes educator. Also given information about diet & prediabetes. Will bring back next month for early gtt.  - Referral to Nutrition and Diabetes Services - Cervicovaginal ancillary only - aspirin EC 81 MG tablet; Take 1 tablet (81 mg total) by mouth daily. Take after 12 weeks for prevention of preeclampsia later in pregnancy  Dispense: 300 tablet; Refill: 2  4. Vaginal discharge during pregnancy, antepartum  - Cervicovaginal ancillary only  5. Vaginal irritation  - Cervicovaginal ancillary only  6. Nausea and vomiting during pregnancy  - metoCLOPramide (REGLAN) 10 MG tablet; Take 1 tablet (10 mg total) by mouth every 6 (six) hours.  Dispense: 30 tablet; Refill: 1  7. [redacted] weeks gestation of pregnancy  - Genetic Screening - Comp Met (CMET) - Protein / creatinine ratio, urine - Cervicovaginal ancillary only   Initial labs drawn. Started on ASA Continue prenatal vitamins. Genetic Screening discussed, NIPS: ordered. Ultrasound discussed; fetal anatomic survey:  will schedule at next visit . Problem list reviewed and updated. The nature of [facility]  with multiple MDs and other Advanced Practice Providers was explained to patient; also emphasized that residents, students are part of our team. Routine obstetric precautions reviewed. Return in about 4 weeks (around 11/28/2020) for Routine OB.   Jorje Guild 9:31 AM 10/31/20

## 2020-11-03 LAB — CERVICOVAGINAL ANCILLARY ONLY
Bacterial Vaginitis (gardnerella): NEGATIVE
Candida Glabrata: NEGATIVE
Candida Vaginitis: POSITIVE — AB
Chlamydia: NEGATIVE
Comment: NEGATIVE
Comment: NEGATIVE
Comment: NEGATIVE
Comment: NEGATIVE
Comment: NEGATIVE
Comment: NORMAL
Neisseria Gonorrhea: NEGATIVE
Trichomonas: NEGATIVE

## 2020-11-03 LAB — COMPREHENSIVE METABOLIC PANEL
ALT: 17 IU/L (ref 0–32)
AST: 21 IU/L (ref 0–40)
Albumin/Globulin Ratio: 1.3 (ref 1.2–2.2)
Albumin: 3.7 g/dL — ABNORMAL LOW (ref 3.9–5.0)
Alkaline Phosphatase: 67 IU/L (ref 44–121)
BUN/Creatinine Ratio: 8 — ABNORMAL LOW (ref 9–23)
BUN: 5 mg/dL — ABNORMAL LOW (ref 6–20)
Bilirubin Total: <0.2 mg/dL (ref 0.0–1.2)
CO2: 21 mmol/L (ref 20–29)
Calcium: 8.6 mg/dL — ABNORMAL LOW (ref 8.7–10.2)
Chloride: 106 mmol/L (ref 96–106)
Creatinine, Ser: 0.62 mg/dL (ref 0.57–1.00)
Globulin, Total: 2.9 g/dL (ref 1.5–4.5)
Glucose: 85 mg/dL (ref 70–99)
Potassium: 3.9 mmol/L (ref 3.5–5.2)
Sodium: 140 mmol/L (ref 134–144)
Total Protein: 6.6 g/dL (ref 6.0–8.5)
eGFR: 128 mL/min/{1.73_m2} (ref >59)

## 2020-11-03 LAB — PROTEIN / CREATININE RATIO, URINE
Creatinine, Urine: 230 mg/dL
Protein, Ur: 22.7 mg/dL
Protein/Creat Ratio: 99 mg/g creat (ref 0–200)

## 2020-11-04 ENCOUNTER — Other Ambulatory Visit: Payer: Self-pay | Admitting: Student

## 2020-11-04 DIAGNOSIS — B3731 Acute candidiasis of vulva and vagina: Secondary | ICD-10-CM

## 2020-11-04 MED ORDER — TERCONAZOLE 0.4 % VA CREA: 1.0000 | TOPICAL_CREAM | Freq: Every day | VAGINAL | 0 refills | Status: DC

## 2020-11-06 ENCOUNTER — Other Ambulatory Visit: Payer: Self-pay

## 2020-11-06 ENCOUNTER — Ambulatory Visit: Payer: Self-pay | Admitting: Registered"

## 2020-11-06 ENCOUNTER — Encounter: Payer: Self-pay | Attending: Student | Admitting: Registered"

## 2020-11-06 DIAGNOSIS — O24919 Unspecified diabetes mellitus in pregnancy, unspecified trimester: Secondary | ICD-10-CM | POA: Insufficient documentation

## 2020-11-06 DIAGNOSIS — O99210 Obesity complicating pregnancy, unspecified trimester: Secondary | ICD-10-CM | POA: Insufficient documentation

## 2020-11-06 DIAGNOSIS — O99891 Other specified diseases and conditions complicating pregnancy: Secondary | ICD-10-CM | POA: Insufficient documentation

## 2020-11-06 DIAGNOSIS — R7303 Prediabetes: Secondary | ICD-10-CM | POA: Insufficient documentation

## 2020-11-06 NOTE — Progress Notes (Signed)
Interpreter Trilby Leaver [phone] AMN video  Patient was seen for Gestational Diabetes self-management on 11/06/20 Start time 0093 and End time 1025   Estimated due date: 05/12/21; [redacted]w[redacted]d  Clinical: Medications: aspirin, reglan, prenatal vitamin Medical History: prediabetes Labs: OGTT n/a, A1c 6.0%   Dietary and Lifestyle History: Pt reports no previous GDM. Pt states since diagnosis has stopped drinking soda and trying to eat more vegetables. Pt states she is craving sweet foods and states eats whatever will stay down.   Physical Activity: daily walking ~30 min, goes to park with 5 yrs old daughter Stress: high due to taking care of daughter, declines counseling referral Sleep: not assessed  24 hr Recall:  First Meal: beans and cream Snack: fruit Second meal: sandwich Snack: fruit Third meal: papusa made with corn meal Snack: Beverages: water, Gatorade zero  NUTRITION INTERVENTION  Nutrition education (E-1) on the following topics:   Initial Follow-up  [x]  []  Definition of Gestational Diabetes []  []  Why dietary management is important in controlling blood glucose [x]  []  Effects each nutrient has on blood glucose levels []  []  Simple carbohydrates vs complex carbohydrates []  []  Fluid intake [x]  []  Creating a balanced meal plan [x]  []  Carbohydrate counting  [x]  []  When to check blood glucose levels [x]  []  Proper blood glucose monitoring techniques [x]  []  Effect of stress and stress reduction techniques  [x]  []  Exercise effect on blood glucose levels, appropriate exercise during pregnancy []  []  Importance of limiting caffeine and abstaining from alcohol and smoking []  []  Medications used for blood sugar control during pregnancy []  []  Hypoglycemia and rule of 15 []  []  Postpartum self care  Blood glucose monitor given: Prodigy Lot # CBG: 97 mg/dL  Patient instructed to monitor glucose levels: FBS: 60 - ? 95 mg/dL (some clinics use 90 for cutoff) 1 hour: ? 140  mg/dL 2 hour: ? mg/dL  Patient received handouts: Nutrition Diabetes and Pregnancy Carbohydrate Counting List  Patient will be seen for follow-up as needed.

## 2020-11-12 ENCOUNTER — Other Ambulatory Visit: Payer: Self-pay

## 2020-11-12 ENCOUNTER — Encounter: Payer: Self-pay | Admitting: Certified Nurse Midwife

## 2020-12-10 ENCOUNTER — Encounter: Payer: Self-pay | Admitting: Obstetrics and Gynecology

## 2020-12-10 ENCOUNTER — Other Ambulatory Visit: Payer: Self-pay

## 2020-12-10 ENCOUNTER — Ambulatory Visit (INDEPENDENT_AMBULATORY_CARE_PROVIDER_SITE_OTHER): Payer: Self-pay | Admitting: Obstetrics and Gynecology

## 2020-12-10 VITALS — BP 107/72 | HR 82 | Temp 98.0°F

## 2020-12-10 DIAGNOSIS — Z789 Other specified health status: Secondary | ICD-10-CM

## 2020-12-10 DIAGNOSIS — R7303 Prediabetes: Secondary | ICD-10-CM

## 2020-12-10 DIAGNOSIS — Z3480 Encounter for supervision of other normal pregnancy, unspecified trimester: Secondary | ICD-10-CM

## 2020-12-10 DIAGNOSIS — O99210 Obesity complicating pregnancy, unspecified trimester: Secondary | ICD-10-CM

## 2020-12-10 DIAGNOSIS — Z3A18 18 weeks gestation of pregnancy: Secondary | ICD-10-CM

## 2020-12-10 NOTE — Patient Instructions (Addendum)
Ultrasound Apt 12/22/2020@11 :15am  Address is [address] [place]   Dolor del ligamento Redondo Round Ligament Pain Los ligamentos redondos son un par de tejidos que se asemejan a un cordn que ayudan a Dietitian. Pueden convertirse en una fuente de dolor durante el embarazo a medida que los ligamentos se ablandan y se estiran con el crecimiento del beb. El dolor suele comenzar en el segundo trimestre (de 13 a 28 semanas) de [place], y solo debe durar unos segundos cuando se produce. Sin embargo, Chief Technology Officer puede ir y Tax adviser que el nacimiento del beb. El dolor no le causa dao al beb. El dolor del ligamento redondo suele ser agudo y punzante, y durar poco tiempo, pero tambin puede ser sordo, persistente y continuo. Se lo percibe en la regin inferior del abdomen o en la ingle. A menudo comienza en la zona ms profunda de la ingle y se extiende hacia regin externa de la cadera. El dolor puede producirse cuando usted: Cambia sbitamente de posicin, como pasar rpidamente de estar sentada a ponerse de pie. Hace actividad fsica. Tose o estornuda. Siga estas instrucciones en su casa: Control del dolor  Cuando el dolor comience, reljese. Luego pruebe cualquiera de estos mtodos para Acupuncturist dolor: Sintese. Flexione las rodillas hacia el abdomen. Acustese de costado con una almohada debajo del abdomen y [employer] las piernas. Sintese en una baera con agua tibia durante 15 a 20 minutos o hasta que el dolor desaparezca. Instrucciones generales Controle su afeccin para detectar cualquier cambio. Muvase lentamente cuando se siente o se ponga de pie. Suspenda o reduzca las actividades fsicas si Public relations account executive. No haga caminatas largas si le generan dolor. Use los medicamentos de venta libre y los recetados solamente como se lo haya indicado el mdico. Concurra a todas las visitas de seguimiento. Esto es importante. Comunquese con un mdico si: El dolor no  desaparece con Scientist, research (medical). Tiene un dolor en la espalda que no tena antes. El medicamento no resulta eficaz. Tiene fiebre o escalofros. Tiene nuseas o vmitos. Tiene diarrea. Siente dolor al [employer]. Solicite ayuda de inmediato si: Siente un dolor de espalda que es rtmico y de tipo clico, similar a las contracciones del [place]. Las contracciones del parto suelen aparecer cada 2 minutos, tienen una duracin de aproximadamente 1 minuto, e implican una sensacin de empujar o presin en la pelvis. Tiene una hemorragia vaginal abundante. Estos sntomas pueden representar un problema grave que constituye Radio broadcast assistant. No espere a ver si los sntomas desaparecen. Solicite atencin mdica de inmediato. Comunquese con el servicio de emergencias de su localidad (911 en los [place]). No conduzca por sus propios medios [employer]. Resumen El dolor del ligamento redondo se siente en la parte inferior del abdomen o la ingle. El dolor suele comenzar en el segundo trimestre (de 13 a 28 semanas) de [place], y solo debe durar unos segundos cuando se produce. Usted puede notar el dolor cuando cambia sbitamente de posicin, cuando tose o estornuda, o durante la actividad fsica. Relajarse, flexionar las rodillas hacia el abdomen, acostarse sobre un lado o tomar un bao de agua tibia pueden ayudar a Engineer, site. Comunquese con su mdico si el dolor no desaparece. Esta informacin no tiene Theme park manager el consejo del mdico. Asegrese de hacerle al mdico cualquier pregunta que tenga. Document Revised: 03/25/2020 Document Reviewed: 03/25/2020 Elsevier Patient Education  2022 [employer].

## 2020-12-10 NOTE — Progress Notes (Addendum)
   LOW-RISK PREGNANCY OFFICE VISIT Patient name: Leah Garcia MRN [id number]  Date of birth: 11/20/1995 Chief Complaint:   Routine Prenatal Visit  History of Present Illness:   Leah Garcia is a 25 y.o. G2P1001 female at [redacted]w[redacted]d with an Estimated Date of Delivery: 05/12/21 being seen today for ongoing management of a low-risk pregnancy.  Today she complains of pelvic pressure with walking. She reports "something feels weird, like bloating and heavy."  Contractions: Not present. Vag. Bleeding: None.  Movement: Present. denies leaking of fluid. Review of Systems:   Pertinent items are noted in HPI Denies abnormal vaginal discharge w/ itching/odor/irritation, headaches, visual changes, shortness of breath, chest pain, abdominal pain, severe nausea/vomiting, or problems with urination or bowel movements unless otherwise stated above. Pertinent History Reviewed:  Reviewed past medical,surgical, social, obstetrical and family history.  Reviewed problem list, medications and allergies. Physical Assessment:   Vitals:   12/10/20 0843  BP: 107/72  Pulse: 82  Temp: 98 F (36.7 C)  There is no height or weight on file to calculate BMI.        Physical Examination:   General appearance: Well appearing, and in no distress  Mental status: Alert, oriented to person, place, and time  Skin: Warm & dry  Cardiovascular: Normal heart rate noted  Respiratory: Normal respiratory effort, no distress  Abdomen: Soft, gravid, nontender  Pelvic: Cervical exam deferred         Extremities: Edema: None  Fetal Status: Fetal Heart Rate (bpm): 147   Movement: Present    No results found for this or any previous visit (from the past 24 hour(s)).  Assessment & Plan:  1) Low-risk pregnancy G2P1001 at [redacted]w[redacted]d with an Estimated Date of Delivery: 05/12/21   2) Supervision of other normal pregnancy, antepartum  - Advised to purchase maternity support belt from [employer]  3) [redacted] weeks  gestation of pregnancy  4) Prediabetes  - Glucose Tolerance, 2 Hours w/1 Hour  5) Obesity affecting pregnancy, antepartum  - Taking bASA  6) Language barrier affecting health care  - [employer] Video Spanish Interpreter, [place] #[id number] used for entire visit    Meds: No orders of the defined types were placed in this encounter.  Labs/procedures today: 2 hr GTT  Plan:  Continue routine obstetrical care   Reviewed: Preterm labor symptoms and general obstetric precautions including but not limited to vaginal bleeding, contractions, leaking of fluid and fetal movement were reviewed in detail with the patient.  All questions were answered.  Follow-up: Follow-up in 4 weeks  Orders Placed This Encounter  Procedures   Glucose Tolerance, 2 Hours w/1 Hour   Raelyn Mora MSN, CNM 12/10/2020

## 2020-12-11 LAB — GLUCOSE TOLERANCE, 2 HOURS W/ 1HR
Glucose, 1 hour: 164 mg/dL (ref 70–179)
Glucose, 2 hour: 105 mg/dL (ref 70–152)
Glucose, Fasting: 89 mg/dL (ref 70–91)

## 2020-12-15 ENCOUNTER — Telehealth: Payer: Self-pay | Admitting: *Deleted

## 2020-12-15 NOTE — Telephone Encounter (Signed)
Patient called reporting right abdominal and hip pain. Patient stated is feels like a period cramp. Pt denies vaginal bleeding. Advised patient that it could be ligament pain. She should try taking Tylenol and apply heat. If that does not help to call office for an appointment or speak with provider at next office visit.   [address] Interpreter: [place] ID [phone].  Clovis Pu, RN

## 2021-01-04 NOTE — L&D Delivery Note (Signed)
OB/GYN Faculty Practice Delivery Note ? ?Leah Garcia is a 26 y.o. G2P1001 s/p VD at [redacted]w[redacted]d.  ? ?ROM: 2h 75m with Clear fluid ?GBS Status: Negative ?Maximum Maternal Temperature: 98.5  ? ?Labor Progress: ?Patient was admitted for IOL s/t GDM-A1.  She received 3 doses of cytotec and cooks catheter before being started on pitocin.  She progressed to complete and delivered as below with staff  ? ?Delivery Date/Time: May 03, 2021 at 0719 ?Delivery: Called to room and patient was 9 cm, but progressing quickly.  Provider remained at bedside.  Patient reports rectal pressure and urge to push.  Encouraged with delivery of fetal head in Left Occiput Anterior with restitution to LOT. No nuchal cord present. Shoulder and body delivered in usual fashion. Infant with spontaneous cry, but dried and stimulated by provider prior to being placed on mother's abdomen where nurse's continued drying and stimulating. Cord clamped x 2 after 3-minute delay and cut by FOB-Alex. Cord blood drawn. Placenta delivered spontaneously with gentle cord traction. Fundus firm with massage and Pitocin. Labia, perineum, vagina, and cervix inspected and noted to have hemostatic left side periuretheral abrasion noted.  ? ?Placenta: Intact, Disposal ?Complications: None ?Lacerations: Left Side Periurethral Abrasion-Hemostatic ?EBL: 300 ?Analgesia: None ? ?Postpartum Planning ?[X]  message to sent to schedule follow-up  ?[ ]  vaccines UTD ? ?Infant: Female-[name]  APGARs 9,9  , 6lb 11oz 19in ? ? , CNM  ?05/03/2021 8:02 AM ? ?

## 2021-01-07 ENCOUNTER — Ambulatory Visit (INDEPENDENT_AMBULATORY_CARE_PROVIDER_SITE_OTHER): Payer: Self-pay

## 2021-01-07 ENCOUNTER — Other Ambulatory Visit: Payer: Self-pay

## 2021-01-07 ENCOUNTER — Other Ambulatory Visit (HOSPITAL_COMMUNITY)
Admission: RE | Admit: 2021-01-07 | Discharge: 2021-01-07 | Disposition: A | Discharge status: Home / Self Care | Payer: Self-pay | Source: Ambulatory Visit | Attending: Student | Admitting: Student

## 2021-01-07 VITALS — BP 108/67 | HR 92 | Temp 98.1°F | Wt 247.6 lb

## 2021-01-07 DIAGNOSIS — O99210 Obesity complicating pregnancy, unspecified trimester: Secondary | ICD-10-CM

## 2021-01-07 DIAGNOSIS — Z3480 Encounter for supervision of other normal pregnancy, unspecified trimester: Secondary | ICD-10-CM | POA: Insufficient documentation

## 2021-01-07 DIAGNOSIS — R350 Frequency of micturition: Secondary | ICD-10-CM

## 2021-01-07 DIAGNOSIS — O26899 Other specified pregnancy related conditions, unspecified trimester: Secondary | ICD-10-CM

## 2021-01-07 DIAGNOSIS — Z3A22 22 weeks gestation of pregnancy: Secondary | ICD-10-CM | POA: Insufficient documentation

## 2021-01-07 DIAGNOSIS — N898 Other specified noninflammatory disorders of vagina: Secondary | ICD-10-CM | POA: Insufficient documentation

## 2021-01-07 DIAGNOSIS — Z789 Other specified health status: Secondary | ICD-10-CM

## 2021-01-07 NOTE — Progress Notes (Signed)
° °  PRENATAL VISIT NOTE  Subjective:  Leah Garcia is a 26 y.o. G2P1001 at [redacted]w[redacted]d being seen today for ongoing prenatal care.  She is currently monitored for the following issues for this low-risk pregnancy and has Diverticulitis of colon; SIRS (systemic inflammatory response syndrome) (HCC); Diverticulitis; Supervision of other normal pregnancy, antepartum; Obesity affecting pregnancy, antepartum; Prediabetes; Diabetes mellitus during pregnancy, antepartum; and Language barrier affecting health care on their problem list.  Patient reports vaginal pressure, urinary frequency, and vaginal discharge/itching for the last 2 days. Discharge also has an odor. Contractions: Not present. Vag. Bleeding: None.  Movement: Present. Denies leaking of fluid.   The following portions of the patient's history were reviewed and updated as appropriate: allergies, current medications, past family history, past medical history, past social history, past surgical history and problem list.   Objective:   Vitals:   01/07/21 1051  BP: 108/67  Pulse: 92  Temp: 98.1 F (36.7 C)  Weight: 247 lb 9.6 oz (112.3 kg)    Fetal Status: Fetal Heart Rate (bpm): 156   Movement: Present     General:  Alert, oriented and cooperative. Patient is in no acute distress.  Skin: Skin is warm and dry. No rash noted.   Cardiovascular: Normal heart rate noted  Respiratory: Normal respiratory effort, no problems with respiration noted  Abdomen: Soft, gravid, appropriate for gestational age.  Pain/Pressure: Present     Pelvic: Cervical exam deferred        Extremities: Normal range of motion.  Edema: None  Mental Status: Normal mood and affect. Normal behavior. Normal judgment and thought content.   Assessment and Plan:  Pregnancy: G2P1001 at [redacted]w[redacted]d 1. Supervision of other normal pregnancy, antepartum - ROB - Reviewed [place] from [place]. Fetal growth noted to be < 3%. Repeat US ordered for fetal growth in 4 weeks.  -  Anticipatory guidance for upcoming appointment provided - GTT and labs at next visit  - Cervicovaginal ancillary only( [place]) - Culture, OB Urine  2. [redacted] weeks gestation of pregnancy   3. Urinary frequency  - Culture, OB Urine  4. Vaginal discharge during pregnancy, antepartum  - Cervicovaginal ancillary only( [place])  5. Vaginal itching   6. Language barrier affecting health care - Spanish interpretor used through entirety of today's visit  7. Obesity affecting pregnancy, antepartum - Taking bASA  Preterm labor symptoms and general obstetric precautions including but not limited to vaginal bleeding, contractions, leaking of fluid and fetal movement were reviewed in detail with the patient. Please refer to After Visit Summary for other counseling recommendations.   Return in about 5 weeks (around 02/11/2021).  Future Appointments  Date Time Provider Department Center  02/18/2021  8:15 AM Bernerd Limbo, CNM [facility] None    Brand Males, CNM 01/07/21 1:26 PM

## 2021-01-08 ENCOUNTER — Other Ambulatory Visit: Payer: Self-pay

## 2021-01-08 DIAGNOSIS — B3731 Acute candidiasis of vulva and vagina: Secondary | ICD-10-CM

## 2021-01-08 LAB — CERVICOVAGINAL ANCILLARY ONLY
Bacterial Vaginitis (gardnerella): NEGATIVE
Candida Glabrata: NEGATIVE
Candida Vaginitis: POSITIVE — AB
Chlamydia: NEGATIVE
Comment: NEGATIVE
Comment: NEGATIVE
Comment: NEGATIVE
Comment: NEGATIVE
Comment: NEGATIVE
Comment: NORMAL
Neisseria Gonorrhea: NEGATIVE
Trichomonas: NEGATIVE

## 2021-01-08 MED ORDER — TERCONAZOLE 0.4 % VA CREA: 1.0000 | TOPICAL_CREAM | Freq: Every day | VAGINAL | 0 refills | Status: DC

## 2021-01-09 LAB — AFP, SERUM, OPEN SPINA BIFIDA
AFP MoM: 0.99
AFP Value: 57 ng/mL
Gest. Age on Collection Date: 22 weeks
Maternal Age At EDD: 23.9 yr
OSBR Risk 1 IN: 10000
Test Results:: NEGATIVE
Weight: 247 [lb_av]

## 2021-01-09 LAB — URINE CULTURE, OB REFLEX

## 2021-01-09 LAB — CULTURE, OB URINE

## 2021-01-12 ENCOUNTER — Telehealth: Payer: Self-pay | Admitting: *Deleted

## 2021-01-12 NOTE — Telephone Encounter (Signed)
Patient verified DOB. Spoke with patient regarding results.  Advised of positive yeast infection and to pick up vaginal cream from [facility].  Clovis Pu, RN

## 2021-01-12 NOTE — Telephone Encounter (Signed)
-----   Message from Brand Males, CNM sent at 01/08/2021  3:13 PM EST ----- Can you please let patient know that she has another yeast infection. I've sent terazol vaginal cream to her pharmacy!  Thanks! Duwayne Heck

## 2021-02-18 ENCOUNTER — Ambulatory Visit (INDEPENDENT_AMBULATORY_CARE_PROVIDER_SITE_OTHER): Payer: Self-pay | Admitting: Certified Nurse Midwife

## 2021-02-18 ENCOUNTER — Other Ambulatory Visit (HOSPITAL_COMMUNITY)
Admission: RE | Admit: 2021-02-18 | Discharge: 2021-02-18 | Disposition: A | Discharge status: Home / Self Care | Payer: Self-pay | Source: Ambulatory Visit | Attending: Certified Nurse Midwife | Admitting: Certified Nurse Midwife

## 2021-02-18 ENCOUNTER — Other Ambulatory Visit: Payer: Self-pay

## 2021-02-18 VITALS — BP 116/72 | HR 85 | Temp 97.8°F | Wt 252.8 lb

## 2021-02-18 DIAGNOSIS — O99210 Obesity complicating pregnancy, unspecified trimester: Secondary | ICD-10-CM

## 2021-02-18 DIAGNOSIS — O26843 Uterine size-date discrepancy, third trimester: Secondary | ICD-10-CM

## 2021-02-18 DIAGNOSIS — O24420 Gestational diabetes mellitus in childbirth, diet controlled: Secondary | ICD-10-CM

## 2021-02-18 DIAGNOSIS — Z3493 Encounter for supervision of normal pregnancy, unspecified, third trimester: Secondary | ICD-10-CM | POA: Insufficient documentation

## 2021-02-18 DIAGNOSIS — O26899 Other specified pregnancy related conditions, unspecified trimester: Secondary | ICD-10-CM

## 2021-02-18 DIAGNOSIS — N898 Other specified noninflammatory disorders of vagina: Secondary | ICD-10-CM

## 2021-02-18 DIAGNOSIS — Z3A28 28 weeks gestation of pregnancy: Secondary | ICD-10-CM

## 2021-02-18 DIAGNOSIS — B3731 Acute candidiasis of vulva and vagina: Secondary | ICD-10-CM

## 2021-02-18 NOTE — Progress Notes (Signed)
° °  PRENATAL VISIT NOTE  Subjective:  Leah Garcia is a 26 y.o. G2P1001 at [redacted]w[redacted]d being seen today for ongoing prenatal care.  She is currently monitored for the following issues for this low-risk pregnancy and has Diverticulitis of colon; SIRS (systemic inflammatory response syndrome) (HCC); Diverticulitis; Supervision of other normal pregnancy, antepartum; Obesity affecting pregnancy, antepartum; Prediabetes; Diabetes mellitus during pregnancy, antepartum; and Language barrier affecting health care on their problem list.  Patient reports  white vaginal discharge since treatment for yeast. Was itchy at first but that went away, now still has copious discharge and some cramping. No contractions .  Contractions: Not present. Vag. Bleeding: None.  Movement: Present. Denies leaking of fluid. Has questions about size/dating of the baby because she was told at her U/S that her dates were inconsistent. Also wants to know the results of her last GTT.  The following portions of the patient's history were reviewed and updated as appropriate: allergies, current medications, past family history, past medical history, past social history, past surgical history and problem list.   Objective:   Vitals:   02/18/21 0828  BP: 116/72  Pulse: 85  Temp: 97.8 F (36.6 C)  Weight: 252 lb 12.8 oz (114.7 kg)   Fetal Status: Fetal Heart Rate (bpm): 152   Movement: Present   Measures 29cm but likely mostly due to maternal habitus.  General:  Alert, oriented and cooperative. Patient is in no acute distress.  Skin: Skin is warm and dry. No rash noted.   Cardiovascular: Normal heart rate noted  Respiratory: Normal respiratory effort, no problems with respiration noted  Abdomen: Soft, gravid, appropriate for gestational age.  Pain/Pressure: Present     Pelvic: Cervical exam deferred        Extremities: Normal range of motion.  Edema: None  Mental Status: Normal mood and affect. Normal behavior. Normal  judgment and thought content.   Assessment and Plan:  Pregnancy: G2P1001 at [redacted]w[redacted]d 1. Supervision of low-risk pregnancy, third trimester - Doing well, feeling regular and vigorous fetal movement   2. [redacted] weeks gestation of pregnancy - Routine OB care  - Reviewed GTT results and explained why we are repeating today. - Reviewed [place] report, which dated baby a month earlier than her LMP but they only changed her EDD by 4 days. Reached out to MFM for clarification - Dr. Parke Poisson recommended changing her EDD to 05/07/21 and scheduling her for a growth scan asap.  - Glucose Tolerance, 2 Hours w/1 Hour - HIV Antibody (routine testing w rflx) - RPR - CBC - Cervicovaginal ancillary only( [place])  3. Vaginal discharge during pregnancy, antepartum - Cervicovaginal ancillary only( [place])  4. Fetal size inconsistent with dates - [place] MFM OB Follow Up  Preterm labor symptoms and general obstetric precautions including but not limited to vaginal bleeding, contractions, leaking of fluid and fetal movement were reviewed in detail with the patient. Please refer to After Visit Summary for other counseling recommendations.   Return in about 2 weeks (around 03/04/2021) for IN-PERSON, LOB.  Future Appointments  Date Time Provider Department Center  03/04/2021  8:35 AM Leftwich-Kirby, Wilmer Floor, CNM [facility] None   Bernerd Limbo, CNM

## 2021-02-19 LAB — CBC
Hematocrit: 31.8 % — ABNORMAL LOW (ref 34.0–46.6)
Hemoglobin: 10.8 g/dL — ABNORMAL LOW (ref 11.1–15.9)
MCH: 28.3 pg (ref 26.6–33.0)
MCHC: 34 g/dL (ref 31.5–35.7)
MCV: 83 fL (ref 79–97)
Platelets: 202 10*3/uL (ref 150–450)
RBC: 3.82 x10E6/uL (ref 3.77–5.28)
RDW: 13.6 % (ref 11.7–15.4)
WBC: 9.9 10*3/uL (ref 3.4–10.8)

## 2021-02-19 LAB — CERVICOVAGINAL ANCILLARY ONLY
Bacterial Vaginitis (gardnerella): NEGATIVE
Candida Glabrata: NEGATIVE
Candida Vaginitis: POSITIVE — AB
Comment: NEGATIVE
Comment: NEGATIVE
Comment: NEGATIVE

## 2021-02-19 LAB — GLUCOSE TOLERANCE, 2 HOURS W/ 1HR
Glucose, 1 hour: 176 mg/dL (ref 70–179)
Glucose, 2 hour: 115 mg/dL (ref 70–152)
Glucose, Fasting: 94 mg/dL — ABNORMAL HIGH (ref 70–91)

## 2021-02-19 LAB — HIV ANTIBODY (ROUTINE TESTING W REFLEX): HIV Screen 4th Generation wRfx: NONREACTIVE

## 2021-02-19 LAB — RPR: RPR Ser Ql: NONREACTIVE

## 2021-02-20 DIAGNOSIS — O24420 Gestational diabetes mellitus in childbirth, diet controlled: Secondary | ICD-10-CM | POA: Insufficient documentation

## 2021-02-20 MED ORDER — TERCONAZOLE 0.4 % VA CREA: 1.0000 | TOPICAL_CREAM | Freq: Every day | VAGINAL | 0 refills | Status: DC

## 2021-02-20 NOTE — Addendum Note (Signed)
Addended by: Edd Arbour on: 02/20/2021 08:32 AM   Modules accepted: Orders

## 2021-02-23 ENCOUNTER — Telehealth: Payer: Self-pay

## 2021-02-23 NOTE — Telephone Encounter (Addendum)
-----   Message from Bernerd Limbo, [place] sent at 02/20/2021  8:31 AM EST ----- This patient will be transferring to [facility] for care from [facility]. Please call with Spanish interpreter to discuss her GDM diagnosis and get her set up for an appt with the diabetes educator. Thank you!  Called pt with Spanish Interpreter Raquel M., and informed pt that she has GDM and she needs to be set up with diabetes education.  Diabetes Education scheduled for 03/10/21.  Pt explained that she would receive testing supplies for free as she does not have insurance.  Pt verbalized understanding with no further questions.    Leonette Nutting  02/26/21

## 2021-02-25 ENCOUNTER — Ambulatory Visit: Payer: Self-pay | Attending: Certified Nurse Midwife

## 2021-02-25 ENCOUNTER — Other Ambulatory Visit: Payer: Self-pay | Admitting: *Deleted

## 2021-02-25 ENCOUNTER — Encounter: Payer: Self-pay | Admitting: *Deleted

## 2021-02-25 ENCOUNTER — Other Ambulatory Visit: Payer: Self-pay

## 2021-02-25 ENCOUNTER — Ambulatory Visit (HOSPITAL_BASED_OUTPATIENT_CLINIC_OR_DEPARTMENT_OTHER): Payer: Self-pay | Admitting: Obstetrics

## 2021-02-25 ENCOUNTER — Ambulatory Visit: Payer: Self-pay | Admitting: *Deleted

## 2021-02-25 VITALS — BP 100/63 | HR 83

## 2021-02-25 DIAGNOSIS — O99210 Obesity complicating pregnancy, unspecified trimester: Secondary | ICD-10-CM

## 2021-02-25 DIAGNOSIS — O24410 Gestational diabetes mellitus in pregnancy, diet controlled: Secondary | ICD-10-CM

## 2021-02-25 DIAGNOSIS — Z3493 Encounter for supervision of normal pregnancy, unspecified, third trimester: Secondary | ICD-10-CM

## 2021-02-25 DIAGNOSIS — Z363 Encounter for antenatal screening for malformations: Secondary | ICD-10-CM | POA: Insufficient documentation

## 2021-02-25 DIAGNOSIS — O99213 Obesity complicating pregnancy, third trimester: Secondary | ICD-10-CM

## 2021-02-25 DIAGNOSIS — R638 Other symptoms and signs concerning food and fluid intake: Secondary | ICD-10-CM

## 2021-02-25 DIAGNOSIS — E669 Obesity, unspecified: Secondary | ICD-10-CM | POA: Insufficient documentation

## 2021-02-25 DIAGNOSIS — O26849 Uterine size-date discrepancy, unspecified trimester: Secondary | ICD-10-CM | POA: Insufficient documentation

## 2021-02-25 DIAGNOSIS — Z3A29 29 weeks gestation of pregnancy: Secondary | ICD-10-CM

## 2021-02-25 DIAGNOSIS — Z3A28 28 weeks gestation of pregnancy: Secondary | ICD-10-CM

## 2021-02-25 DIAGNOSIS — Z3689 Encounter for other specified antenatal screening: Secondary | ICD-10-CM

## 2021-02-25 DIAGNOSIS — O99212 Obesity complicating pregnancy, second trimester: Secondary | ICD-10-CM | POA: Insufficient documentation

## 2021-02-25 DIAGNOSIS — O26843 Uterine size-date discrepancy, third trimester: Secondary | ICD-10-CM | POA: Insufficient documentation

## 2021-02-25 NOTE — Progress Notes (Signed)
MFM Note  Leah Garcia was seen for an ultrasound exam due to maternal obesity and recently diagnosed diet-controlled gestational diabetes.  Based on an ultrasound that was performed at [facility], her [facility] was changed to May 07, 2021.  She denies any other problems in her current pregnancy.  She was informed that the fetal growth and amniotic fluid level appears appropriate for her gestational age.  The fetal biometry measurements obtained today are consistent with an [facility] of May 07, 2021.  The views of the fetal anatomy were limited today due to her advanced gestational age.  The following were discussed with the patient today:  Gestational diabetes  The implications and management of diabetes in pregnancy was discussed in detail with the patient.    She was advised to continue to monitor her fingersticks 4 times daily (fasting and 2 hours after each meal).    She was advised that our goals for her fingerstick values are fasting values of 90-95 or less and two-hour postprandial values of 120 or less.    Should the majority of her fingerstick results be above these values, she may have to be started on metformin or insulin to help her achieve better glycemic control. The patient was advised that getting her fingerstick values as close to these goals as possible would provide her with the most optimal obstetrical outcome.  Due to gestational diabetes, we will continue to follow her with monthly growth ultrasounds.    Weekly fetal testing should be started at 32 weeks should she require insulin or metformin for treatment.  The increased risk of polyhydramnios, fetal macrosomia, and preeclampsia associated with diabetes was also discussed.    The patient was advised that delivery for well-controlled diabetes in pregnancy is usually recommended at around 39 weeks.  Delivery at 37 weeks may be considered should her glycemic control be poor.  The patient stated that all  of her questions have been answered to her satisfaction.    A follow-up growth scan was scheduled in 4 weeks.  All conversations were held with the patient today with the help of a Spanish interpreter.   . A total of 30 minutes was spent counseling and coordinating the care for this patient.  Greater than 50% of the time was spent in direct face-to-face contact.

## 2021-03-04 ENCOUNTER — Ambulatory Visit (INDEPENDENT_AMBULATORY_CARE_PROVIDER_SITE_OTHER): Payer: Self-pay | Admitting: Advanced Practice Midwife

## 2021-03-04 ENCOUNTER — Other Ambulatory Visit: Payer: Self-pay

## 2021-03-04 VITALS — BP 105/67 | HR 89 | Wt 252.4 lb

## 2021-03-04 DIAGNOSIS — R7303 Prediabetes: Secondary | ICD-10-CM

## 2021-03-04 DIAGNOSIS — Z3A30 30 weeks gestation of pregnancy: Secondary | ICD-10-CM

## 2021-03-04 DIAGNOSIS — O99210 Obesity complicating pregnancy, unspecified trimester: Secondary | ICD-10-CM

## 2021-03-04 DIAGNOSIS — O24420 Gestational diabetes mellitus in childbirth, diet controlled: Secondary | ICD-10-CM

## 2021-03-04 DIAGNOSIS — Z3480 Encounter for supervision of other normal pregnancy, unspecified trimester: Secondary | ICD-10-CM

## 2021-03-04 DIAGNOSIS — N949 Unspecified condition associated with female genital organs and menstrual cycle: Secondary | ICD-10-CM

## 2021-03-04 NOTE — Progress Notes (Signed)
? ?  PRENATAL VISIT NOTE ? ?Subjective:  ?Leah Garcia is a 26 y.o. G2P1001 at [redacted]w[redacted]d being seen today for ongoing prenatal care.  She is currently monitored for the following issues for this low-risk pregnancy and has Diverticulitis of colon; SIRS (systemic inflammatory response syndrome) (HCC); Diverticulitis; Supervision of other normal pregnancy, antepartum; Obesity affecting pregnancy, antepartum; Prediabetes; Diabetes mellitus during pregnancy, antepartum; Language barrier affecting health care; and Gestational diabetes mellitus (GDM) in childbirth, diet controlled on their problem list. ? ?Patient reports  lower abdominal pain with movement .  Contractions: Irritability. Vag. Bleeding: None.  Movement: Present. Denies leaking of fluid.  ? ?The following portions of the patient's history were reviewed and updated as appropriate: allergies, current medications, past family history, past medical history, past social history, past surgical history and problem list.  ? ?Objective:  ? ?Vitals:  ? 03/04/21 0853  ?BP: 105/67  ?Pulse: 89  ?Weight: 252 lb 6.4 oz (114.5 kg)  ? ? ?Fetal Status: Fetal Heart Rate (bpm): 144   Movement: Present    ? ?General:  Alert, oriented and cooperative. Patient is in no acute distress.  ?Skin: Skin is warm and dry. No rash noted.   ?Cardiovascular: Normal heart rate noted  ?Respiratory: Normal respiratory effort, no problems with respiration noted  ?Abdomen: Soft, gravid, appropriate for gestational age.  Pain/Pressure: Present     ?Pelvic: Cervical exam deferred        ?Extremities: Normal range of motion.  Edema: None  ?Mental Status: Normal mood and affect. Normal behavior. Normal judgment and thought content.  ? ?Assessment and Plan:  ?Pregnancy: G2P1001 at [redacted]w[redacted]d ?1. Supervision of other normal pregnancy, antepartum ?--Anticipatory guidance about next visits/weeks of pregnancy given.  ?--next appt in 1 week for glucose check, then every 2 weeks ? ?2. Obesity  affecting pregnancy, antepartum ? ? ? ?3. Gestational diabetes mellitus (GDM) in childbirth, diet controlled ?--8 out of 9 fasting glucose out of range, 2 out of 32 postprandial out of range ?--Discussed options of starting PM metformin or diet changes with close follow up.  ?--Pt to try bedtime snack with protein x 1 week then f/u for blood glucose review, will start metformin if still out of range. ?--Growth [place] on 3/23 ? ?4. [redacted] weeks gestation of pregnancy ? ?5. Round ligament pain ?--Rest/ice/heat/warm bath/increase PO fluids/Tylenol/pregnancy support belt  ?  ?Preterm labor symptoms and general obstetric precautions including but not limited to vaginal bleeding, contractions, leaking of fluid and fetal movement were reviewed in detail with the patient. ?Please refer to After Visit Summary for other counseling recommendations.  ? ?No follow-ups on file. ? ?Future Appointments  ?Date Time Provider Department Center  ?03/09/2021  8:55 AM Leftwich-Kirby, Wilmer Floor, CNM [facility] None  ?03/10/2021  9:15 AM [facility]  ?03/26/2021  9:30 AM [facility] NURSE [facility]  ?03/26/2021  9:45 AM [facility] US5 [facility]  ? ? ?Sharen Counter, CNM  ?

## 2021-03-09 ENCOUNTER — Encounter: Payer: Self-pay | Admitting: Advanced Practice Midwife

## 2021-03-09 NOTE — Progress Notes (Deleted)
? ?  PRENATAL VISIT NOTE ? ?Subjective:  ?Leah Garcia is a 26 y.o. G2P1001 at [redacted]w[redacted]d being seen today for ongoing prenatal care.  She is currently monitored for the following issues for this {Blank single:19197::"high-risk","low-risk"} pregnancy and has Diverticulitis of colon; SIRS (systemic inflammatory response syndrome) ([place]); Diverticulitis; Supervision of other normal pregnancy, antepartum; Obesity affecting pregnancy, antepartum; Prediabetes; Diabetes mellitus during pregnancy, antepartum; Language barrier affecting health care; and Gestational diabetes mellitus (GDM) in childbirth, diet controlled on their problem list. ? ?Patient reports {sx:14538}.   .  .   . Denies leaking of fluid.  ? ?The following portions of the patient's history were reviewed and updated as appropriate: allergies, current medications, past family history, past medical history, past social history, past surgical history and problem list.  ? ?Objective:  ?There were no vitals filed for this visit. ? ?Fetal Status:          ? ?General:  Alert, oriented and cooperative. Patient is in no acute distress.  ?Skin: Skin is warm and dry. No rash noted.   ?Cardiovascular: Normal heart rate noted  ?Respiratory: Normal respiratory effort, no problems with respiration noted  ?Abdomen: Soft, gravid, appropriate for gestational age.        ?Pelvic: {Blank single:19197::"Cervical exam performed in the presence of a chaperone","Cervical exam deferred"}        ?Extremities: Normal range of motion.     ?Mental Status: Normal mood and affect. Normal behavior. Normal judgment and thought content.  ? ?Assessment and Plan:  ?Pregnancy: G2P1001 at [redacted]w[redacted]d ?1. Supervision of other normal pregnancy, antepartum ?*** ? ?2. Gestational diabetes mellitus (GDM) in childbirth, diet controlled ?--Reviewed glucose log. Fasting values:   ** Postprandial ** ? ?{Blank single:19197::"Term","Preterm"} labor symptoms and general obstetric precautions including  but not limited to vaginal bleeding, contractions, leaking of fluid and fetal movement were reviewed in detail with the patient. ?Please refer to After Visit Summary for other counseling recommendations.  ? ?No follow-ups on file. ? ?Future Appointments  ?Date Time Provider [place]  ?03/09/2021  8:55 AM [place]-Kirby, Kathie Dike, CNM [facility] None  ?03/10/2021  9:15 AM [facility]ED[facility]ATION [facility]  ?03/26/2021  9:30 AM [facility] NURSE [facility]  ?03/26/2021  9:45 AM [facility] US5 [facility]  ? ? ?Fatima Blank, CNM  ?

## 2021-03-10 ENCOUNTER — Encounter: Payer: Self-pay | Attending: Student | Admitting: Registered"

## 2021-03-10 ENCOUNTER — Other Ambulatory Visit: Payer: Self-pay

## 2021-03-10 ENCOUNTER — Ambulatory Visit (INDEPENDENT_AMBULATORY_CARE_PROVIDER_SITE_OTHER): Payer: Self-pay | Admitting: Registered"

## 2021-03-10 DIAGNOSIS — Z713 Dietary counseling and surveillance: Secondary | ICD-10-CM | POA: Insufficient documentation

## 2021-03-10 DIAGNOSIS — R7303 Prediabetes: Secondary | ICD-10-CM | POA: Insufficient documentation

## 2021-03-10 DIAGNOSIS — O24420 Gestational diabetes mellitus in childbirth, diet controlled: Secondary | ICD-10-CM

## 2021-03-10 DIAGNOSIS — O99891 Other specified diseases and conditions complicating pregnancy: Secondary | ICD-10-CM | POA: Insufficient documentation

## 2021-03-10 DIAGNOSIS — Z3A00 Weeks of gestation of pregnancy not specified: Secondary | ICD-10-CM | POA: Insufficient documentation

## 2021-03-10 DIAGNOSIS — O99210 Obesity complicating pregnancy, unspecified trimester: Secondary | ICD-10-CM | POA: Insufficient documentation

## 2021-03-10 DIAGNOSIS — E669 Obesity, unspecified: Secondary | ICD-10-CM | POA: Insufficient documentation

## 2021-03-10 NOTE — Progress Notes (Signed)
Interpreter Madelin Headings [phone] AMN video ? ?Patient was seen for Gestational Diabetes self-management on 03/10/21 ?Start time [phone] and End time [phone]  ? ?Estimated due date: 05/12/21; [redacted]w[redacted]d ? ?Clinical: ?Medications: aspirin, reglan, prenatal vitamin ?Medical History: prediabetes ?Labs: OGTT n/a, A1c 6.0%  ? ? ? ?Dietary and Lifestyle History: ?Pt reports she has cut back portions and sometimes is hungry after a meal and will eat nuts. Pt states she has cut back her fruit consumption and when she eats fruit will have [place] yogurt or peanut butter with it.  ? ?Pt states she has made some of these changes after her MD appt on 3/1 and was confused when FBS was elevated next day. Pt states sometimes she forgets to wash hands before checking blood sugar. ? ?Pt states she does not have a snack before bed. ? ?Pt was concerned that she was going to take shots for blood sugar control. Pt seemed relieved when told her provider would likely be prescribing metformin. ? ?Physical Activity: 3x/week walking ~30 min ?Stress: high due to taking care of daughter, declines counseling referral ?Sleep: not assessed ? ?24 hr Recall: not assessed ? ?NUTRITION INTERVENTION  ?Nutrition education (E-1) on the following topics:  ? ?Discussed role of fat in the evening meal that can result in high FBS next day.  ?Importance of clean hands, soap and water, prior to checking blood sugar. Can try wiping first drop of blood before testing. ?Strategy of bedtime snack  ? ?Initial Follow-up ? ?[x]  []  Definition of Gestational Diabetes ?[]  []  Why dietary management is important in controlling blood glucose ?[x]  []  Effects each nutrient has on blood glucose levels ?[]  []  Simple carbohydrates vs complex carbohydrates ?[]  []  Fluid intake ?[x]  []  Creating a balanced meal plan ?[x]  []  Carbohydrate counting  ?[x]  []  When to check blood glucose levels ?[x]  []  Proper blood glucose monitoring techniques ?[x]  []  Effect of stress and stress reduction techniques   ?[x]  []  Exercise effect on blood glucose levels, appropriate exercise during pregnancy ?[]  []  Importance of limiting caffeine and abstaining from alcohol and smoking ?[]  [x]  Medications used for blood sugar control during pregnancy ?[]  []  Hypoglycemia and rule of 15 ?[]  []  Postpartum self care ? ? ?Patient instructed to monitor glucose levels: ?FBS: 60 - ? 95 mg/dL (some clinics use 90 for cutoff) ?1 hour: ? 140 mg/dL ?2 hour: ? mg/dL ? ?Patient received handouts: ?none ? ?Patient will be seen for follow-up as needed.  ?

## 2021-03-12 ENCOUNTER — Other Ambulatory Visit: Payer: Self-pay

## 2021-03-18 ENCOUNTER — Ambulatory Visit (INDEPENDENT_AMBULATORY_CARE_PROVIDER_SITE_OTHER): Payer: Self-pay | Admitting: Obstetrics and Gynecology

## 2021-03-18 ENCOUNTER — Other Ambulatory Visit: Payer: Self-pay

## 2021-03-18 VITALS — BP 107/71 | HR 92 | Wt 252.5 lb

## 2021-03-18 DIAGNOSIS — O99210 Obesity complicating pregnancy, unspecified trimester: Secondary | ICD-10-CM

## 2021-03-18 DIAGNOSIS — Z789 Other specified health status: Secondary | ICD-10-CM

## 2021-03-18 DIAGNOSIS — Z6841 Body Mass Index (BMI) 40.0 and over, adult: Secondary | ICD-10-CM

## 2021-03-18 DIAGNOSIS — R7303 Prediabetes: Secondary | ICD-10-CM

## 2021-03-18 DIAGNOSIS — Z3480 Encounter for supervision of other normal pregnancy, unspecified trimester: Secondary | ICD-10-CM

## 2021-03-18 DIAGNOSIS — Z603 Acculturation difficulty: Secondary | ICD-10-CM

## 2021-03-18 DIAGNOSIS — O24410 Gestational diabetes mellitus in pregnancy, diet controlled: Secondary | ICD-10-CM

## 2021-03-18 NOTE — Progress Notes (Signed)
? ?  PRENATAL VISIT NOTE ? ?Subjective:  ?Leah Garcia is a 26 y.o. G2P1001 at [redacted]w[redacted]d being seen today for ongoing prenatal care.  She is currently monitored for the following issues for this high-risk pregnancy and has Diverticulitis of colon; SIRS (systemic inflammatory response syndrome) ([place]); Diverticulitis; Supervision of other normal pregnancy, antepartum; Obesity affecting pregnancy, antepartum; Prediabetes; Diabetes mellitus during pregnancy, antepartum; Language barrier affecting health care; Gestational diabetes mellitus (GDM) in childbirth, diet controlled; and BMI 40.0-44.9, adult ([place]) on their problem list. ? ?Patient doing well with no acute concerns today. She reports no complaints.  Contractions: Not present. Vag. Bleeding: None.  Movement: Present. Denies leaking of fluid.  ? ?The following portions of the patient's history were reviewed and updated as appropriate: allergies, current medications, past family history, past medical history, past social history, past surgical history and problem list. Problem list updated. ? ?Objective:  ? ?Vitals:  ? 03/18/21 1037  ?BP: 107/71  ?Pulse: 92  ?Weight: 252 lb 8 oz (114.5 kg)  ? ? ?Fetal Status: Fetal Heart Rate (bpm): 147 (Simultaneous filing. User may not have seen previous data.)   Movement: Present    ? ?General:  Alert, oriented and cooperative. Patient is in no acute distress.  ?Skin: Skin is warm and dry. No rash noted.   ?Cardiovascular: Normal heart rate noted  ?Respiratory: Normal respiratory effort, no problems with respiration noted  ?Abdomen: Soft, gravid, appropriate for gestational age.  Pain/Pressure: Present     ?Pelvic: Cervical exam deferred        ?Extremities: Normal range of motion.  Edema: None  ?Mental Status:  Normal mood and affect. Normal behavior. Normal judgment and thought content.  ? ?Assessment and Plan:  ?Pregnancy: G2P1001 at [redacted]w[redacted]d ? ?1. Supervision of other normal pregnancy, antepartum ?Continue routine  care ? ?2. Obesity affecting pregnancy, antepartum ? ?3. Diet controlled gestational diabetes mellitus (GDM), antepartum ?FBS: 88-96 ?PPBS: 83-133, most within range, no changes at this time ? ?Pt has ultrasound previously scheduled for 03/26/21 ? ?5. Language barrier affecting health care ?Teleinterpreter used ? ?6. BMI 40.0-44.9, adult ([place]) ? ? ?Preterm labor symptoms and general obstetric precautions including but not limited to vaginal bleeding, contractions, leaking of fluid and fetal movement were reviewed in detail with the patient. ? ?Please refer to After Visit Summary for other counseling recommendations.  ? ?Return in about 2 weeks (around 04/01/2021) for [facility], in person. ? ? ?Lynnda Shields, MD ?Faculty Attending ?[facility] ?  ?

## 2021-03-18 NOTE — Progress Notes (Signed)
Pt reports fetal movement with some pressure. 

## 2021-03-26 ENCOUNTER — Other Ambulatory Visit: Payer: Self-pay | Admitting: *Deleted

## 2021-03-26 ENCOUNTER — Ambulatory Visit: Payer: Self-pay | Admitting: *Deleted

## 2021-03-26 ENCOUNTER — Other Ambulatory Visit: Payer: Self-pay

## 2021-03-26 ENCOUNTER — Ambulatory Visit: Payer: Self-pay | Attending: Obstetrics

## 2021-03-26 ENCOUNTER — Encounter: Payer: Self-pay | Admitting: *Deleted

## 2021-03-26 VITALS — BP 103/58 | HR 98

## 2021-03-26 DIAGNOSIS — O24410 Gestational diabetes mellitus in pregnancy, diet controlled: Secondary | ICD-10-CM | POA: Insufficient documentation

## 2021-03-26 DIAGNOSIS — O24419 Gestational diabetes mellitus in pregnancy, unspecified control: Secondary | ICD-10-CM

## 2021-03-26 DIAGNOSIS — Z3A34 34 weeks gestation of pregnancy: Secondary | ICD-10-CM

## 2021-03-26 DIAGNOSIS — Z3689 Encounter for other specified antenatal screening: Secondary | ICD-10-CM | POA: Insufficient documentation

## 2021-03-26 DIAGNOSIS — R638 Other symptoms and signs concerning food and fluid intake: Secondary | ICD-10-CM | POA: Insufficient documentation

## 2021-04-01 ENCOUNTER — Ambulatory Visit (INDEPENDENT_AMBULATORY_CARE_PROVIDER_SITE_OTHER): Payer: Self-pay | Admitting: Obstetrics and Gynecology

## 2021-04-01 ENCOUNTER — Other Ambulatory Visit: Payer: Self-pay

## 2021-04-01 VITALS — BP 100/68 | HR 85 | Wt 251.0 lb

## 2021-04-01 DIAGNOSIS — Z789 Other specified health status: Secondary | ICD-10-CM

## 2021-04-01 DIAGNOSIS — Z3480 Encounter for supervision of other normal pregnancy, unspecified trimester: Secondary | ICD-10-CM

## 2021-04-01 DIAGNOSIS — O24420 Gestational diabetes mellitus in childbirth, diet controlled: Secondary | ICD-10-CM

## 2021-04-01 DIAGNOSIS — Z603 Acculturation difficulty: Secondary | ICD-10-CM

## 2021-04-01 DIAGNOSIS — Z6841 Body Mass Index (BMI) 40.0 and over, adult: Secondary | ICD-10-CM

## 2021-04-01 DIAGNOSIS — Z3A34 34 weeks gestation of pregnancy: Secondary | ICD-10-CM

## 2021-04-01 NOTE — Progress Notes (Signed)
? ?  PRENATAL VISIT NOTE ? ?Subjective:  ?Leah Garcia is a 26 y.o. G2P1001 at [redacted]w[redacted]d being seen today for ongoing prenatal care.  She is currently monitored for the following issues for this high-risk pregnancy and has Diverticulitis of colon; SIRS (systemic inflammatory response syndrome) ([place]); Diverticulitis; Supervision of other normal pregnancy, antepartum; Obesity affecting pregnancy, antepartum; Prediabetes; Diabetes mellitus during pregnancy, antepartum; Language barrier affecting health care; Gestational diabetes mellitus (GDM) in childbirth, diet controlled; and BMI 40.0-44.9, adult ([place]) on their problem list. ? ?Patient doing well with no acute concerns today. She reports occasional contractions and occasional abdominal pain first thing in the morning .  Contractions: Irregular. Vag. Bleeding: None.  Movement: Present. Denies leaking of fluid.  ? ?The following portions of the patient's history were reviewed and updated as appropriate: allergies, current medications, past family history, past medical history, past social history, past surgical history and problem list. Problem list updated. ? ?Objective:  ? ?Vitals:  ? 04/01/21 0931  ?BP: 100/68  ?Pulse: 85  ?Weight: 251 lb (113.9 kg)  ? ? ?Fetal Status: Fetal Heart Rate (bpm): 140 Fundal Height: 36 cm Movement: Present    ? ?General:  Alert, oriented and cooperative. Patient is in no acute distress.  ?Skin: Skin is warm and dry. No rash noted.   ?Cardiovascular: Normal heart rate noted  ?Respiratory: Normal respiratory effort, no problems with respiration noted  ?Abdomen: Soft, gravid, appropriate for gestational age.  Pain/Pressure: Present     ?Pelvic: Cervical exam deferred        ?Extremities: Normal range of motion.     ?Mental Status:  Normal mood and affect. Normal behavior. Normal judgment and thought content.  ? ?Assessment and Plan:  ?Pregnancy: G2P1001 at [redacted]w[redacted]d ? ?1. [redacted] weeks gestation of pregnancy ? ? ?2. Gestational diabetes  mellitus (GDM) in childbirth, diet controlled ?FBS: 93-105, most within range, pt advised to monitor her evening food intake ?PPBS: 103-120 ? ?3. Supervision of other normal pregnancy, antepartum ?Continue routine care, swabs at next visit ? ?4. Language barrier affecting health care ?Live interpreter present ? ?5. BMI 40.0-44.9, adult ([place]) ? ? ?Preterm labor symptoms and general obstetric precautions including but not limited to vaginal bleeding, contractions, leaking of fluid and fetal movement were reviewed in detail with the patient. ? ?Please refer to After Visit Summary for other counseling recommendations.  ? ?Return in about 1 week (around 04/08/2021) for in person, 36 weeks swabs, HOB. ? ? ?Lynnda Shields, MD ?Faculty Attending ?[facility] ?  ?

## 2021-04-01 NOTE — Progress Notes (Signed)
Pt states fasting glucose has been a little elevated. ? ?

## 2021-04-08 ENCOUNTER — Encounter: Payer: Self-pay | Admitting: Obstetrics and Gynecology

## 2021-04-08 ENCOUNTER — Ambulatory Visit (INDEPENDENT_AMBULATORY_CARE_PROVIDER_SITE_OTHER): Payer: Self-pay | Admitting: Obstetrics and Gynecology

## 2021-04-08 ENCOUNTER — Other Ambulatory Visit (HOSPITAL_COMMUNITY)
Admission: RE | Admit: 2021-04-08 | Discharge: 2021-04-08 | Disposition: A | Discharge status: Home / Self Care | Payer: Medicaid Other | Source: Ambulatory Visit | Attending: Obstetrics and Gynecology | Admitting: Obstetrics and Gynecology

## 2021-04-08 VITALS — BP 103/71 | HR 80 | Wt 254.7 lb

## 2021-04-08 DIAGNOSIS — Z3480 Encounter for supervision of other normal pregnancy, unspecified trimester: Secondary | ICD-10-CM

## 2021-04-08 DIAGNOSIS — O24420 Gestational diabetes mellitus in childbirth, diet controlled: Secondary | ICD-10-CM

## 2021-04-08 DIAGNOSIS — Z789 Other specified health status: Secondary | ICD-10-CM

## 2021-04-08 DIAGNOSIS — O99210 Obesity complicating pregnancy, unspecified trimester: Secondary | ICD-10-CM

## 2021-04-08 LAB — OB RESULTS CONSOLE GC/CHLAMYDIA: Gonorrhea: NEGATIVE

## 2021-04-08 NOTE — Progress Notes (Signed)
? ?  PRENATAL VISIT NOTE ? ?Subjective:  ?Leah Garcia is a 26 y.o. G2P1001 at [redacted]w[redacted]d being seen today for ongoing prenatal care.  She is currently monitored for the following issues for this high-risk pregnancy and has Diverticulitis of colon; SIRS (systemic inflammatory response syndrome) ([place]); Diverticulitis; Supervision of other normal pregnancy, antepartum; Obesity affecting pregnancy, antepartum; Prediabetes; Diabetes mellitus during pregnancy, antepartum; Language barrier affecting health care; Gestational diabetes mellitus (GDM) in childbirth, diet controlled; and BMI 40.0-44.9, adult ([place]) on their problem list. ? ?Patient reports no complaints.  Contractions: Not present. Vag. Bleeding: None.  Movement: Present. Denies leaking of fluid.  ? ?The following portions of the patient's history were reviewed and updated as appropriate: allergies, current medications, past family history, past medical history, past social history, past surgical history and problem list.  ? ?Objective:  ? ?Vitals:  ? 04/08/21 0854  ?BP: 103/71  ?Pulse: 80  ?Weight: 254 lb 11.2 oz (115.5 kg)  ? ? ?Fetal Status: Fetal Heart Rate (bpm): 146   Movement: Present    ? ?General:  Alert, oriented and cooperative. Patient is in no acute distress.  ?Skin: Skin is warm and dry. No rash noted.   ?Cardiovascular: Normal heart rate noted  ?Respiratory: Normal respiratory effort, no problems with respiration noted  ?Abdomen: Soft, gravid, appropriate for gestational age.  Pain/Pressure: Present     ?Pelvic: Cervical exam performed in the presence of a chaperone        ?Extremities: Normal range of motion.  Edema: None  ?Mental Status: Normal mood and affect. Normal behavior. Normal judgment and thought content.  ? ?Assessment and Plan:  ?Pregnancy: G2P1001 at [redacted]w[redacted]d ?1. Supervision of other normal pregnancy, antepartum ?Patient is doing well without complaints ?Cultures today ? ?2. Obesity affecting pregnancy, antepartum ?Patient  is not taking ASA ? ?3. Gestational diabetes mellitus (GDM) in childbirth, diet controlled ?CBGs reviewed and majority within range ?Continue diet control ?Follow up growth ultrasound ? ?4. Language barrier affecting health care ?Spanish interpreter used ? ?Preterm labor symptoms and general obstetric precautions including but not limited to vaginal bleeding, contractions, leaking of fluid and fetal movement were reviewed in detail with the patient. ?Please refer to After Visit Summary for other counseling recommendations.  ? ?Return in about 1 week (around 04/15/2021) for in person, ROB, High risk. ? ?Future Appointments  ?Date Time Provider [place]  ?04/24/2021  3:30 PM [facility] NURSE [facility]  ?04/24/2021  3:45 PM [facility] US1 [facility]  ? ? ?Mora Bellman, MD ? ?

## 2021-04-08 NOTE — Progress Notes (Signed)
Pt in for ROB visit. Pt does not have any concerns at this time.  ?

## 2021-04-09 LAB — CERVICOVAGINAL ANCILLARY ONLY
Chlamydia: NEGATIVE
Comment: NEGATIVE
Comment: NORMAL
Neisseria Gonorrhea: NEGATIVE

## 2021-04-12 LAB — CULTURE, BETA STREP (GROUP B ONLY): Strep Gp B Culture: NEGATIVE

## 2021-04-15 ENCOUNTER — Encounter: Payer: Self-pay | Admitting: Obstetrics and Gynecology

## 2021-04-15 ENCOUNTER — Ambulatory Visit (INDEPENDENT_AMBULATORY_CARE_PROVIDER_SITE_OTHER): Payer: Self-pay | Admitting: Obstetrics and Gynecology

## 2021-04-15 ENCOUNTER — Encounter: Payer: Self-pay | Admitting: Obstetrics

## 2021-04-15 ENCOUNTER — Other Ambulatory Visit (HOSPITAL_COMMUNITY)
Admission: RE | Admit: 2021-04-15 | Discharge: 2021-04-15 | Disposition: A | Discharge status: Home / Self Care | Payer: Self-pay | Source: Ambulatory Visit | Attending: Obstetrics | Admitting: Obstetrics

## 2021-04-15 VITALS — BP 108/74 | HR 94 | Wt 257.1 lb

## 2021-04-15 DIAGNOSIS — Z789 Other specified health status: Secondary | ICD-10-CM

## 2021-04-15 DIAGNOSIS — N898 Other specified noninflammatory disorders of vagina: Secondary | ICD-10-CM | POA: Insufficient documentation

## 2021-04-15 DIAGNOSIS — Z3480 Encounter for supervision of other normal pregnancy, unspecified trimester: Secondary | ICD-10-CM

## 2021-04-15 DIAGNOSIS — O24420 Gestational diabetes mellitus in childbirth, diet controlled: Secondary | ICD-10-CM

## 2021-04-15 NOTE — Progress Notes (Signed)
Pt in office for ROB visit. Pt c/o of a lot fluid leaking with an odor on Monday.  ?

## 2021-04-15 NOTE — Addendum Note (Signed)
Addended by: Dalphine Handing on: 04/15/2021 04:52 PM ? ? Modules accepted: Orders ? ?

## 2021-04-15 NOTE — Patient Instructions (Addendum)
Parto vaginal ?Vaginal Delivery ?Parto vaginal significa que usted da a luz empujando al beb? fuera del canal del parto (vagina). Su equipo de atenci?n m?dica la ayudar? antes, durante y despu?s del parto vaginal. ?Las experiencias de los nacimientos son ?nicas para todas las [place], y Engineer, technical sales y las experiencias de nacimiento var?an seg?n d?nde elija dar a Actuary. ??Cu?les son los riesgos y beneficios? ?En general, esto es seguro. Sin embargo, pueden ocurrir complicaciones, por ejemplo: ?Sangrado. ?Infecci?n. ?Da?o a otras estructuras, como desgarro vaginal. ?Reacciones al?rgicas a los medicamentos. ?A pesar de los riesgos, los beneficios del parto vaginal incluyen un menor riesgo de sangrado e infecci?n y un menor tiempo de recuperaci?n en comparaci?n con el parto por ces?rea. El parto por ces?rea es el parto del beb? por medios quir?rgicos. ??Qu? ocurrir? cuando llegue al centro de parto o al hospital? ?Una vez que se inicie el [place] de parto y haya sido admitida en el hospital o centro de parto, su equipo de atenci?n m?dica podr? hacer lo siguiente: ?Revisar sus antecedentes de Media planner y cualquier inquietud que usted pueda tener. ?Hablar con usted sobre su plan de nacimiento y [place] opciones para Financial controller. ?Controlarle la presi?n arterial, la respiraci?n y los latidos card?acos. ?Evaluar los latidos card?acos del beb?Marland Kitchen ?Monitorear el ?tero para TEFL teacher. ?Verificar si la bolsa de agua (saco amni?tico) se ha roto (ruptura). ?Colocarle una v?a intravenosa en una de las venas. Esto se podr? usar para administrarle l?quidos y medicamentos. ?Monitoreo ?Su equipo de atenci?n m?dica puede evaluar las contracciones (monitoreo uterino) y la frecuencia card?aca del beb? (monitoreo fetal). Es posible que el monitoreo se necesite realizar: ?Con frecuencia, pero no continuamente (intermitentemente). ?Todo el tiempo o durante largos per?odos a la vez (continuamente). El monitoreo continuo  puede ser necesario si: ?Est? recibiendo determinados medicamentos, tales como medicamentos para Best boy o para hacer que las contracciones sean m?s fuertes. ?Tiene complicaciones durante el embarazo o el [place] de [place]. ?El monitoreo se puede realizar: ?Al colocar un estetoscopio especial o un dispositivo manual de monitoreo en el abdomen o verificar los latidos card?acos del beb? y Programmer, multimedia las contracciones. ?Al colocar monitores en el abdomen (monitores externos) para Museum/gallery curator los latidos card?acos del beb? y la frecuencia y [place] contracciones. ?Al colocar monitores dentro del ?tero a trav?s de la vagina (monitores internos) para Museum/gallery curator los latidos card?acos del beb? y Scientist, forensic, duraci?n y fuerza de sus contracciones. Seg?n el tipo de monitor, International aid/development worker en el ?tero o en la cabeza del beb? hasta el nacimiento. ?Telemetr?a. Se trata de un tipo de monitoreo continuo que se puede Optometrist con monitores externos o internos. En lugar de Passenger transport manager en la cama, usted puede desplazarse. ?Examen f?sico ?Su equipo de atenci?n m?dica puede realizar ex?menes f?sicos frecuentes. Esto puede incluir: ?Verificar c?mo y d?nde el beb? est? ubicado en el ?tero. ?Verificar el cuello uterino para determinar: ?Si se est? afinando o estirando (borrando). ?Si se est? abriendo (dilatando). ??Qu? [place] y [place]? ?El [place] de parto y el parto normales se dividen en tres etapas: ?Etapa 1 ?Esta es la etapa m?s larga del [place] de [place]. ?Durante esta etapa, sentir? contracciones. En general, las contracciones son leves, infrecuentes e irregulares al principio. Se hacen m?s fuertes, m?s frecuentes y m?s regulares a medida que avanza en esta etapa. Puede tener contracciones cada 2 o 3 minutos. ?Esta etapa finaliza cuando el cuello uterino est? completamente dilatado hasta  4 pulgadas (10 cm) y completamente borrado. ?Etapa 2 ?Esta etapa comienza una vez que el  cuello uterino est? totalmente borrado y dilatado, y dura hasta el nacimiento del beb?Marland Kitchen ?Esta es la etapa en la que va a sentir ganas de pujar al beb? fuera de la vagina. ?Puede sentir un dolor urente y por estiramiento, especialmente cuando la parte m?s ancha de la cabeza del beb? pasa a trav?s de la abertura vaginal (coronaci?n). ?Una vez que el beb? nace, el cord?n umbilical se pinzar? y se cortar?. El momento de cortar el cord?n depender? de sus deseos, de la salud del beb? y de los m?todos del m?dico. ?Colocar?n al beb? sobre su pecho desnudo (contacto piel con piel) en una posici?n erguida y [place] con [place] abrigada. Si optar? por amamantar, observe al beb? para detectar se?ales de hambre, como el reflejo de b?squeda o succi?n, y ac?rquelo al pecho para su primera alimentaci?n. ?Etapa 3 ?Esta etapa comienza inmediatamente despu?s del nacimiento del beb? y finaliza despu?s de la expulsi?n de la placenta. ?Esta etapa puede durar de 5 a 30 minutos. ?Despu?s del nacimiento del beb?, puede sentir contracciones cuando el cuerpo expulsa la placenta. Estas contracciones tambi?n ayudan a que el ?tero reduzca su tama?o y a Warehouse manager. ??Qu? puedo esperar despu?s del [place] de parto y el parto? ?Una vez que termina el [place] de [place], se los evaluar? a usted y al beb? atentamente hasta que est?n listos para ir a casa. Su equipo de atenci?n m?dica le ense?ar? c?mo cuidarse y cuidar a su beb?Marland Kitchen ?Le recomendar?n que usted y el beb? permanezcan en la misma sala (cohabitaci?n) durante su estad?a en el hospital. Esto ayudar? a fomentar un v?nculo temprano y Administrator, Civil Service. ?Se le controlar? y Engineer, maintenance (IT)? el ?tero con regularidad (masaje f?ndico). ?Puede seguir recibiendo l?quidos o medicamentos por v?a intravenosa. ?Tendr? algo de inflamaci?n y Engineer, mining en el abdomen, la vagina y la zona de la piel entre la abertura vaginal y el ano (perineo). ?Si se le realiz? una incisi?n cerca de la vagina (episiotom?a)  o si ha tenido alg?n desgarro vaginal durante el parto, podr?an indicarle que se coloque compresas fr?as sobre la episiotom?a o Art therapist. Esto ayuda a Engineer, materials y la hinchaz?n. ?Es normal tener hemorragia vaginal despu?s del [place]. Use un ap?sito sanitario para el sangrado vaginal y secreci?n. ?Resumen ?Parto vaginal significa que usted dar? a luz empujando al beb? fuera del canal del parto (vagina). ?Su equipo de atenci?n m?dica lo controlar? a usted y al beb? durante las etapas del [place]. ?Despu?s del parto, su equipo de atenci?n m?dica continuar? evalu?ndolos a usted y al beb? para asegurarse de que ambos se est?n recuperando seg?n lo esperado despu?s del parto. ?Esta informaci?n no tiene Theme park manager el consejo del m?dico. Aseg?rese de hacerle al m?dico cualquier pregunta que tenga. ?Document Revised: 11/27/2019 Document Reviewed: 11/27/2019 ?Elsevier Patient Education ? 2022 Elsevier Inc. ?Tercer trimestre de embarazo ?Third Trimester of Pregnancy ?El tercer trimestre de Hospital doctor desde la semana 28 hasta la semana 40. Tambi?n se dice que va desde el mes 7 hasta el mes 9. En este trimestre, el beb? en gestaci?n (feto) crece muy r?pidamente. Hacia el final del noveno mes, el beb? en gestaci?n mide alrededor de 20 pulgadas (45 cm) de largo. Pesa entre 6 y 58 libras (2,70 y 4,50 kg). ?Cambios en el cuerpo durante el tercer trimestre ?Su organismo contin?a atravesando por muchos cambios durante este per?odo. Los cambios var?an y  generalmente vuelven a la normalidad despu?s del nacimiento del beb?Marland Kitchen ?Cambios f?sicos ?Seguir? aumentando de [place]. Puede ser que aumente entre 25 y 35 libras (11 y 16 kg) hacia el final del Psychiatrist. Si tiene [employer], puede aumentar entre 28 y 40 lb (unos 13 a 18 kg). Si tiene sobrepeso, puede aumentar entre 15 y 25 libras (unos 7 a 11 kg). ?Podr?n aparecer las primeras estr?as en las caderas, el vientre (abdomen) y las [place]. ?Las mamas seguir?n creciendo y Conservation officer, historic buildings. Un l?quido amarillo Charity fundraiser) puede salir de sus pechos. Esta es la primera leche que usted produce para el beb?Marland Kitchen ?Tal vez haya cambios en el cabello. ?El ombligo puede salir hacia afuera. ?Puede

## 2021-04-15 NOTE — Progress Notes (Signed)
Subjective:  ?Leah Garcia is a 26 y.o. G2P1001 at [redacted]w[redacted]d being seen today for ongoing prenatal care.  She is currently monitored for the following issues for this high-risk pregnancy and has Diverticulitis of colon; SIRS (systemic inflammatory response syndrome) ([place]); Supervision of other normal pregnancy, antepartum; Obesity affecting pregnancy, antepartum; Prediabetes; Diabetes mellitus during pregnancy, antepartum; Language barrier affecting health care; Gestational diabetes mellitus (GDM) in childbirth, diet controlled; and BMI 40.0-44.9, adult ([place]) on their problem list. ? ?Patient reports no complaints.  Contractions: Irritability. Vag. Bleeding: None.  Movement: Present. Denies leaking of fluid.  ? ?The following portions of the patient's history were reviewed and updated as appropriate: allergies, current medications, past family history, past medical history, past social history, past surgical history and problem list. Problem list updated. ? ?Objective:  ? ?Vitals:  ? 04/15/21 0940  ?BP: 108/74  ?Pulse: 94  ?Weight: 257 lb 1.6 oz (116.6 kg)  ? ? ?Fetal Status:     Movement: Present    ? ?General:  Alert, oriented and cooperative. Patient is in no acute distress.  ?Skin: Skin is warm and dry. No rash noted.   ?Cardiovascular: Normal heart rate noted  ?Respiratory: Normal respiratory effort, no problems with respiration noted  ?Abdomen: Soft, gravid, appropriate for gestational age. Pain/Pressure: Present     ?Pelvic:  Cervical exam deferred        ?Extremities: Normal range of motion.  Edema: None  ?Mental Status: Normal mood and affect. Normal behavior. Normal judgment and thought content.  ? ?Urinalysis:     ? ?Assessment and Plan:  ?Pregnancy: G2P1001 at [redacted]w[redacted]d ? ?1. Supervision of other normal pregnancy, antepartum ?Stable ?Labor precautions ? ?2. Gestational diabetes mellitus (GDM) in childbirth, diet controlled ?CBG's in goal for the most part ?Outliners related to diet  choices ?Continue with diet ?Growth scan next week ? ?3. Language barrier affecting health care ?Liver interrupter used during today's visit ? ?Term labor symptoms and general obstetric precautions including but not limited to vaginal bleeding, contractions, leaking of fluid and fetal movement were reviewed in detail with the patient. ?Please refer to After Visit Summary for other counseling recommendations.  ?Return in about 1 week (around 04/22/2021) for OB visit, face to face, MD only. ? ? ?Chancy Milroy, MD ?

## 2021-04-17 LAB — CERVICOVAGINAL ANCILLARY ONLY
Bacterial Vaginitis (gardnerella): NEGATIVE
Comment: NEGATIVE
Comment: NEGATIVE
Trichomonas: NEGATIVE

## 2021-04-22 ENCOUNTER — Encounter: Payer: Self-pay | Admitting: Obstetrics

## 2021-04-24 ENCOUNTER — Ambulatory Visit: Payer: Self-pay

## 2021-04-24 ENCOUNTER — Encounter: Payer: Self-pay | Admitting: Obstetrics & Gynecology

## 2021-04-24 ENCOUNTER — Ambulatory Visit (INDEPENDENT_AMBULATORY_CARE_PROVIDER_SITE_OTHER): Payer: Self-pay | Admitting: Obstetrics & Gynecology

## 2021-04-24 ENCOUNTER — Encounter (HOSPITAL_COMMUNITY): Payer: Self-pay | Admitting: *Deleted

## 2021-04-24 ENCOUNTER — Telehealth (HOSPITAL_COMMUNITY): Payer: Self-pay | Admitting: *Deleted

## 2021-04-24 VITALS — BP 108/71 | HR 79 | Wt 259.0 lb

## 2021-04-24 DIAGNOSIS — O0993 Supervision of high risk pregnancy, unspecified, third trimester: Secondary | ICD-10-CM

## 2021-04-24 DIAGNOSIS — O99210 Obesity complicating pregnancy, unspecified trimester: Secondary | ICD-10-CM

## 2021-04-24 DIAGNOSIS — Z3A38 38 weeks gestation of pregnancy: Secondary | ICD-10-CM

## 2021-04-24 DIAGNOSIS — O24410 Gestational diabetes mellitus in pregnancy, diet controlled: Secondary | ICD-10-CM

## 2021-04-24 NOTE — Progress Notes (Signed)
? ?PRENATAL VISIT NOTE ? ?Subjective:  ?Leah Garcia is a 26 y.o. G2P1001 at [redacted]w[redacted]d being seen today for ongoing prenatal care. Patient is Spanish-speaking only, interpreter present for this encounter. She is currently monitored for the following issues for this high-risk pregnancy and has Supervision of high-risk pregnancy; Maternal morbid obesity, antepartum ([place]); Prediabetes; Language barrier affecting health care; Gestational diabetes mellitus (GDM) in childbirth, diet controlled; and BMI 40.0-44.9, adult ([place]) on their problem list. ? ?Patient reports no complaints.  Contractions: Irregular. Vag. Bleeding: None.  Movement: Present. Denies leaking of fluid.  ? ?The following portions of the patient's history were reviewed and updated as appropriate: allergies, current medications, past family history, past medical history, past social history, past surgical history and problem list.  ? ?Objective:  ? ?Vitals:  ? 04/24/21 0922  ?BP: 108/71  ?Pulse: 79  ?Weight: 259 lb (117.5 kg)  ? ? ?Fetal Status: Fetal Heart Rate (bpm): 135   Movement: Present    ? ?General:  Alert, oriented and cooperative. Patient is in no acute distress.  ?Skin: Skin is warm and dry. No rash noted.   ?Cardiovascular: Normal heart rate noted  ?Respiratory: Normal respiratory effort, no problems with respiration noted  ?Abdomen: Soft, gravid, appropriate for gestational age.  Pain/Pressure: Present     ?Pelvic: Cervical exam deferred        ?Extremities: Normal range of motion.     ?Mental Status: Normal mood and affect. Normal behavior. Normal judgment and thought content.  ? ?[place] MFM OB FOLLOW UP ? ?Result Date: 03/26/2021 ?----------------------------------------------------------------------  OBSTETRICS REPORT                       (Signed Final 03/26/2021 10:57 am) ---------------------------------------------------------------------- Patient Info  ID #:       [id number]                          D.O.B.:  August 03, 1995 (25 yrs)   Name:       Leah Garcia               Visit Date: 03/26/2021 10:35 am              [facility] ---------------------------------------------------------------------- Performed By  Attending:        Tama High MD        Ref. Address:     [place]  [place]                                                             [place]  Performed By:     Rodrigo Ran BS      [facility]                                                             [place] [facility]                                                             [facility]  Referred By:      [facility] ---------------------------------------------------------------------- Orders  #  Description                           Code        Ordered By  1  [place] MFM OB FOLLOW UP                   [phone]    YU FANG ----------------------------------------------------------------------  #  Order #                     Accession #                Episode #  1  [id number]                   [id number]                 [id number] ---------------------------------------------------------------------- Indications  [redacted] weeks gestation of pregnancy                Z3A.34  Gestational diabetes in pregnancy, diet        O24.410  controlled  Negative AFP  Obesity complicating pregnancy, third          O99.213  trimester (BMI 33)  Antenatal follow-up for nonvisualized fetal    Z36.2  anatomy ---------------------------------------------------------------------- Fetal Evaluation  Num Of Fetuses:         1  Fetal Heart Rate(bpm):  150  Cardiac Activity:       Observed  Presentation:           Cephalic  Placenta:               Anterior  P. Cord Insertion:      Previously Visualized  Amniotic Fluid  AFI FV:       Within normal limits  AFI Sum(cm)     %Tile       Largest Pocket(cm)  15.9            57          5.5  RUQ(cm)       RLQ(cm)       LUQ(cm)        LLQ(cm)  4             3             3.4            5.5 ---------------------------------------------------------------------- Biometry  BPD:      90.5  mm     G. Age:  36w 5d         98  %    CI:        77.98   %    70 - 86                                                          FL/HC:      19.1   %    19.4 - 21.8  HC:      324.3  mm     G. Age:  36w 5d         81  %    HC/AC:      1.07        0.96 - 1.11  AC:      301.7  mm     G. Age:  34w 1d         58  %    FL/BPD:     68.4   %    71 - 87  FL:       61.9  mm     G. Age:  32w 1d          5  %    FL/AC:      20.5   %    20 - 24  HUM:      55.3  mm     G. Age:  32w 1d         20  %  LV:        3.2  mm  Est. FW:    2339  gm      5 lb 3 oz     45  % ---------------------------------------------------------------------- OB History  Gravidity:    2         Term:   1        Prem:   0        SAB:   0  TOP:          0       Ectopic:  0        Living: 1 ---------------------------------------------------------------------- Gestational Age  U/S Today:     35w 0d                                        EDD:   04/30/21  Best:          34w 0d     Det. By:  Loman Chroman  EDD:   05/07/21 ---------------------------------------------------------------------- Anatomy  Cranium:               Appears normal         LVOT:                   Appears normal  Cavum:                 Appears normal         Aortic Arch:            Not well visualized  Ventricles:            Appears normal         Ductal Arch:            Not well visualized  Choroid Plexus:        Appears normal         Diaphragm:              Appears normal  Cerebellum:            Appears normal         Stomach:                Appears normal, left                                                                        sided  Posterior Fossa:       Appears normal          Abdomen:                Appears normal  Nuchal Fold:           Not well visualized    Abdominal Wall:         Not well visualized  Face:                  Appears normal         Cord Vessels:           Previously seen                         (orbits and profile)  Lips:                  Appears normal         Kidneys:                Appear normal  Palate:                Not well visualized    Bladder:                Appears normal  Thoracic:              Appears normal         Spine:                  Appears normal  Heart:                 Previously seen        Upper Extremities:      Previously seen  RVOT:  Previously seen        Lower Extremities:      Previously seen  Other:  3VV, nasal bone, and lenses visualized.  Heels previously visualized.          Open Right hand previously visualized. Technicallly difficult due to          advanced GA and maternal habitus. ---------------------------------------------------------------------- Cervix Uterus Adnexa  Cervix  Not visualized (advanced GA >24wks)  Uterus  No abnormality visualized.  Right Ovary  Not visualized.  Left Ovary  Within normal limits.  Cul De Sac  No free fluid seen.  Adnexa  No abnormality visualized. ---------------------------------------------------------------------- Impression  Amniotic fluid is normal and good fetal activity is seen .Fetal  growth is appropriate for gestational age . Fetal cerebellum  and spine appear normal.  Patient has gestational diabetes that is well controlled on  diet.  She, however, was not checking her blood glucose  because of lack of strips.  I encouraged her to check her  blood glucose regularly. ---------------------------------------------------------------------- Recommendations  -An appointment was made for her to return in 4 weeks for  fetal growth assessment. ----------------------------------------------------------------------                  Tama High, MD Electronically Signed Final  Report   03/26/2021 10:57 am ----------------------------------------------------------------------  ? ?Assessment and Plan:  ?Pregnancy: G2P1001 at [redacted]w[redacted]d ?1. Diet controlled gestational diabetes melli

## 2021-04-24 NOTE — Patient Instructions (Addendum)
Induction is scheduled on 05/02/2021 morning, you will be called and told when to come ? ? ?Return to office for any scheduled appointments. Call the office or go to the MAU at [facility] if: ?You begin to have strong, frequent contractions ?Your water breaks.  Sometimes it is a big gush of fluid, sometimes it is just a trickle that keeps getting your underwear wet or running down your legs ?You have vaginal bleeding.  It is normal to have a small amount of spotting if your cervix was checked.  ?You do not feel your baby moving like normal.  If you do not, get something to eat and drink and lay down and focus on feeling your baby move.   If your baby is still not moving like normal, you should call the office or go to MAU. ?Any other obstetric concerns. ? ? ?Cervical Ripening (to get your cervix ready for labor) : May try one or all: ? ?Walking, intercourse and nipple stimulation ? ?Red Raspberry Leaf capsules:  two 300mg  or 400mg  tablets with each meal, 2-3 times a day  ?Potential Side Effects Of Raspberry Leaf:  ?Most women do not experience any side effects from drinking raspberry leaf tea. However, nausea and loose stools are possible  ? ?Evening Primrose Oil capsules: may take 1 to 3 capsules daily. May also prick one to release the oil and insert it into your vagina at night.  ?Some of the potential side effects:  ?Upset stomach  ?Loose stools or diarrhea  ?Headaches  ?Nausea ? ?6 Dates a day (may taste better if warmed in microwave until soft). Found where raisins are in the grocery store ? ?

## 2021-04-24 NOTE — Telephone Encounter (Signed)
Preadmission screen  

## 2021-04-29 ENCOUNTER — Encounter: Payer: Self-pay | Admitting: *Deleted

## 2021-04-29 ENCOUNTER — Ambulatory Visit (HOSPITAL_BASED_OUTPATIENT_CLINIC_OR_DEPARTMENT_OTHER): Payer: Medicaid Other

## 2021-04-29 ENCOUNTER — Ambulatory Visit: Payer: Self-pay | Attending: Obstetrics and Gynecology | Admitting: *Deleted

## 2021-04-29 VITALS — BP 103/56 | HR 72

## 2021-04-29 DIAGNOSIS — Z3A38 38 weeks gestation of pregnancy: Secondary | ICD-10-CM | POA: Insufficient documentation

## 2021-04-29 DIAGNOSIS — O99213 Obesity complicating pregnancy, third trimester: Secondary | ICD-10-CM | POA: Diagnosis not present

## 2021-04-29 DIAGNOSIS — E669 Obesity, unspecified: Secondary | ICD-10-CM

## 2021-04-29 DIAGNOSIS — O0993 Supervision of high risk pregnancy, unspecified, third trimester: Secondary | ICD-10-CM

## 2021-04-29 DIAGNOSIS — O24419 Gestational diabetes mellitus in pregnancy, unspecified control: Secondary | ICD-10-CM | POA: Diagnosis present

## 2021-04-29 DIAGNOSIS — O24410 Gestational diabetes mellitus in pregnancy, diet controlled: Secondary | ICD-10-CM | POA: Diagnosis not present

## 2021-04-29 DIAGNOSIS — R7303 Prediabetes: Secondary | ICD-10-CM

## 2021-04-29 DIAGNOSIS — Z362 Encounter for other antenatal screening follow-up: Secondary | ICD-10-CM | POA: Diagnosis not present

## 2021-05-02 ENCOUNTER — Inpatient Hospital Stay (HOSPITAL_COMMUNITY): Payer: Medicaid Other

## 2021-05-02 ENCOUNTER — Inpatient Hospital Stay (HOSPITAL_COMMUNITY)
Admission: AD | Admit: 2021-05-02 | Discharge: 2021-05-04 | DRG: 807 | Disposition: A | Discharge status: Home / Self Care | Payer: Medicaid Other | Attending: Obstetrics & Gynecology | Admitting: Obstetrics & Gynecology

## 2021-05-02 ENCOUNTER — Encounter (HOSPITAL_COMMUNITY): Payer: Self-pay | Admitting: Family Medicine

## 2021-05-02 ENCOUNTER — Other Ambulatory Visit: Payer: Self-pay

## 2021-05-02 DIAGNOSIS — Z3A39 39 weeks gestation of pregnancy: Secondary | ICD-10-CM

## 2021-05-02 DIAGNOSIS — O0990 Supervision of high risk pregnancy, unspecified, unspecified trimester: Secondary | ICD-10-CM

## 2021-05-02 DIAGNOSIS — Z789 Other specified health status: Secondary | ICD-10-CM | POA: Diagnosis present

## 2021-05-02 DIAGNOSIS — O24420 Gestational diabetes mellitus in childbirth, diet controlled: Secondary | ICD-10-CM | POA: Diagnosis present

## 2021-05-02 DIAGNOSIS — O0993 Supervision of high risk pregnancy, unspecified, third trimester: Principal | ICD-10-CM

## 2021-05-02 DIAGNOSIS — O24410 Gestational diabetes mellitus in pregnancy, diet controlled: Secondary | ICD-10-CM | POA: Insufficient documentation

## 2021-05-02 DIAGNOSIS — O99214 Obesity complicating childbirth: Secondary | ICD-10-CM | POA: Diagnosis present

## 2021-05-02 DIAGNOSIS — R7303 Prediabetes: Secondary | ICD-10-CM | POA: Diagnosis present

## 2021-05-02 DIAGNOSIS — Z8632 Personal history of gestational diabetes: Secondary | ICD-10-CM | POA: Insufficient documentation

## 2021-05-02 LAB — RPR: RPR Ser Ql: NONREACTIVE

## 2021-05-02 LAB — CBC
HCT: 34.5 % — ABNORMAL LOW (ref 36.0–46.0)
Hemoglobin: 10.8 g/dL — ABNORMAL LOW (ref 12.0–15.0)
MCH: 26.8 pg (ref 26.0–34.0)
MCHC: 31.3 g/dL (ref 30.0–36.0)
MCV: 85.6 fL (ref 80.0–100.0)
Platelets: 198 10*3/uL (ref 150–400)
RBC: 4.03 MIL/uL (ref 3.87–5.11)
RDW: 14.8 % (ref 11.5–15.5)
WBC: 8.9 10*3/uL (ref 4.0–10.5)
nRBC: 0 % (ref 0.0–0.2)

## 2021-05-02 LAB — TYPE AND SCREEN
ABO/RH(D): O POS
Antibody Screen: NEGATIVE

## 2021-05-02 LAB — GLUCOSE, CAPILLARY
Glucose-Capillary: 94 mg/dL (ref 70–99)
Glucose-Capillary: 87 mg/dL (ref 70–99)
Glucose-Capillary: 80 mg/dL (ref 70–99)
Glucose-Capillary: 95 mg/dL (ref 70–99)

## 2021-05-02 MED ORDER — LIDOCAINE HCL (PF) 1 % IJ SOLN
30.0000 mL | INTRAMUSCULAR | Status: DC | PRN
  Filled 2021-05-02: qty 30

## 2021-05-02 MED ORDER — ZOLPIDEM TARTRATE 5 MG PO TABS: 5.0000 mg | ORAL_TABLET | Freq: Every evening | ORAL | Status: DC | PRN

## 2021-05-02 MED ORDER — OXYTOCIN BOLUS FROM INFUSION
333.0000 mL | Freq: Once | INTRAVENOUS | Status: AC
  Administered 2021-05-03: 333 mL via INTRAVENOUS

## 2021-05-02 MED ORDER — LACTATED RINGERS IV SOLN: 500.0000 mL | INTRAVENOUS | Status: DC | PRN

## 2021-05-02 MED ORDER — OXYTOCIN-SODIUM CHLORIDE 30-0.9 UT/500ML-% IV SOLN: 1.0000 m[IU]/min | INTRAVENOUS | Status: DC

## 2021-05-02 MED ORDER — OXYTOCIN-SODIUM CHLORIDE 30-0.9 UT/500ML-% IV SOLN
1.0000 m[IU]/min | INTRAVENOUS | Status: DC
  Administered 2021-05-02: 2 m[IU]/min via INTRAVENOUS
  Filled 2021-05-02: qty 500

## 2021-05-02 MED ORDER — TERBUTALINE SULFATE 1 MG/ML IJ SOLN: 0.2500 mg | Freq: Once | INTRAMUSCULAR | Status: DC | PRN

## 2021-05-02 MED ORDER — OXYTOCIN-SODIUM CHLORIDE 30-0.9 UT/500ML-% IV SOLN: 2.5000 [IU]/h | INTRAVENOUS | Status: DC

## 2021-05-02 MED ORDER — OXYCODONE-ACETAMINOPHEN 5-325 MG PO TABS: 1.0000 | ORAL_TABLET | ORAL | Status: DC | PRN

## 2021-05-02 MED ORDER — TERBUTALINE SULFATE 1 MG/ML IJ SOLN
0.2500 mg | Freq: Once | INTRAMUSCULAR | Status: DC | PRN
  Filled 2021-05-02: qty 1

## 2021-05-02 MED ORDER — OXYCODONE-ACETAMINOPHEN 5-325 MG PO TABS: 2.0000 | ORAL_TABLET | ORAL | Status: DC | PRN

## 2021-05-02 MED ORDER — FENTANYL CITRATE (PF) 100 MCG/2ML IJ SOLN: 50.0000 ug | INTRAMUSCULAR | Status: DC | PRN

## 2021-05-02 MED ORDER — HYDROXYZINE HCL 25 MG PO TABS: 50.0000 mg | ORAL_TABLET | Freq: Four times a day (QID) | ORAL | Status: DC | PRN

## 2021-05-02 MED ORDER — MISOPROSTOL 25 MCG QUARTER TABLET: 25.0000 ug | ORAL_TABLET | ORAL | Status: DC | PRN

## 2021-05-02 MED ORDER — ACETAMINOPHEN 325 MG PO TABS: 650.0000 mg | ORAL_TABLET | ORAL | Status: DC | PRN

## 2021-05-02 MED ORDER — LACTATED RINGERS IV SOLN
INTRAVENOUS | Status: DC
  Administered 2021-05-02 – 2021-05-03 (×2): 950 mL via INTRAVENOUS

## 2021-05-02 MED ORDER — FLEET ENEMA 7-19 GM/118ML RE ENEM
1.0000 | ENEMA | Freq: Every day | RECTAL | Status: DC | PRN
  Filled 2021-05-02: qty 1

## 2021-05-02 MED ORDER — MISOPROSTOL 50MCG HALF TABLET
50.0000 ug | ORAL_TABLET | ORAL | Status: DC | PRN
  Administered 2021-05-02 (×3): 50 ug via ORAL
  Filled 2021-05-02 (×4): qty 1

## 2021-05-02 MED ORDER — ONDANSETRON HCL 4 MG/2ML IJ SOLN: 4.0000 mg | Freq: Four times a day (QID) | INTRAMUSCULAR | Status: DC | PRN

## 2021-05-02 MED ORDER — SOD CITRATE-CITRIC ACID 500-334 MG/5ML PO SOLN: 30.0000 mL | ORAL | Status: DC | PRN

## 2021-05-02 NOTE — Progress Notes (Signed)
Labor Progress Note ?Leah Garcia Leah Garcia is a 26 y.o. G2P1001 at [redacted]w[redacted]d presented for IOL due to GDM.  ? ?S: Feeling some cramping but doing well. FB fell out about an hour ago.  ? ?Spanish interpreter used.  ? ?O:  ?BP 111/71   Pulse 76   Temp 98.2 ?F (36.8 ?C) (Oral)   Resp 18   Ht 5\' 4"  (1.626 m)   Wt (!) 163.8 kg   LMP 07/02/2020   BMI 62.00 kg/m?  ?EFM: 145/mod/15x15/none ? ?CVE: Dilation: 4 ?Effacement (%): 60 ?Cervical Position: Posterior ?Station: -3 ?Presentation: Vertex ?Exam by:: Dr. [email or web address] ? ? ?A&P: 26 y.o. G2P1001 [redacted]w[redacted]d  ?#Labor: Progressing well now s/p FB. Still has some thickness to the cervix but feels soft. Very posterior. Plan to start pit 2x2 after quick meal and then AROM when fetal station improves.  ?#Pain: PRN  ?#FWB: Cat I  ?#GBS negative ? ?#GDM: CBGs appropriate. EFW 30%.  ? ?[redacted]w[redacted]d, DO ?10:54 PM  ?

## 2021-05-02 NOTE — H&P (Addendum)
OBSTETRIC ADMISSION HISTORY AND PHYSICAL ? ?Leah Garcia is a 26 y.o. female G2P1001 with IUP at [redacted]w[redacted]d by [place] presenting for IOL d/t A1GDM. She reports +FMs, No LOF, no VB, no blurry vision, headaches or peripheral edema, and RUQ pain.  She plans on breast feeding. She's undecided for birth control and would like to discuss more in the post partum period ?She received her prenatal care at [facility]  ? ?Dating: By [place] --->  Estimated Date of Delivery: 05/07/21 ? ?Sono:   ? ?@[redacted]w[redacted]d , CWD, normal anatomy, Cephalic presentation,  0000000, 30% EFW ? ? ?Prenatal History/Complications:  ?- Diet controlled gestational diabetes  ? ?Past Medical History: ?Past Medical History:  ?Diagnosis Date  ? Diverticulitis of colon 11/24/2019  ? Gestational diabetes   ? SIRS (systemic inflammatory response syndrome) ([place]) 11/25/2019  ? ? ?Past Surgical History: ?Past Surgical History:  ?Procedure Laterality Date  ? NO PAST SURGERIES    ? ? ?Obstetrical History: ?OB History   ? ? Gravida  ?2  ? Para  ?1  ? Term  ?1  ? Preterm  ?   ? AB  ?   ? Living  ?1  ?  ? ? SAB  ?   ? IAB  ?   ? Ectopic  ?   ? Multiple  ?   ? Live Births  ?1  ?   ?  ?  ? ? ?Social History ?Social History  ? ?Socioeconomic History  ? Marital status: Single  ?  Spouse name: Not on file  ? Number of children: 1  ? Years of education: Not on file  ? Highest education level: 9th grade  ?Occupational History  ? Occupation: unemployed  ?Tobacco Use  ? Smoking status: Never  ?  Passive exposure: Never  ? Smokeless tobacco: Never  ?Vaping Use  ? Vaping Use: Never used  ?Substance and Sexual Activity  ? Alcohol use: Not Currently  ?  Alcohol/week: 5.0 standard drinks  ?  Types: 5 Cans of beer per week  ?  Comment: 5 beers weekly, mostly on the weekend  ? Drug use: Never  ? Sexual activity: Yes  ?Other Topics Concern  ? Not on file  ?Social History Narrative  ? Not on file  ? ?Social Determinants of Health  ? ?Financial Resource Strain: Low Risk   ? Difficulty of Paying  Living Expenses: Not hard at all  ?Food Insecurity: No Food Insecurity  ? Worried About Charity fundraiser in the Last Year: Never true  ? Ran Out of Food in the Last Year: Never true  ?Transportation Needs: No Transportation Needs  ? Lack of Transportation (Medical): No  ? Lack of Transportation (Non-Medical): No  ?Physical Activity: Not on file  ?Stress: No Stress Concern Present  ? Feeling of Stress : Only a little  ?Social Connections: Unknown  ? Frequency of Communication with Friends and Family: Three times a week  ? Frequency of Social Gatherings with Friends and Family: Three times a week  ? Attends Religious Services: Patient refused  ? Active Member of Clubs or Organizations: No  ? Attends Archivist Meetings: Never  ? Marital Status: Living with partner  ? ? ?Family History: ?Family History  ?Problem Relation Age of Onset  ? Heart disease Neg Hx   ? Cancer Neg Hx   ? Diabetes Neg Hx   ? Hypertension Neg Hx   ? ? ?Allergies: ?No Known Allergies ? ?Pt denies allergies to  latex, iodine, or shellfish. ? ?Medications Prior to Admission  ?Medication Sig Dispense Refill Last Dose  ? Prenatal Vit-Fe Fumarate-FA (PRENATAL VITAMIN PO) Take 1 tablet by mouth daily.     ? ? ? ?Review of Systems  ? ?All systems reviewed and negative except as stated in HPI ? ?Blood pressure 115/72, pulse 91, temperature 98.1 ?F (36.7 ?C), temperature source Oral, resp. rate 16, height 5\' 4"  (1.626 m), weight (!) 163.8 kg, last menstrual period 07/02/2020. ?General appearance: alert, cooperative, and appears stated age ?Lungs: clear to auscultation bilaterally ?Heart: regular rate and rhythm ?Abdomen: soft, non-tender; bowel sounds normal ?Extremities: Homans sign is negative, no sign of DVT ?Presentation: cephalic ?Fetal monitoringBaseline: 150 bpm, Variability: Good {> 6 bpm), and Accelerations: Reactive ?Uterine activity: intermittent contraction ?  ? ? ?Prenatal labs: ?ABO, Rh: --/--/PENDING (04/29 0800) ?Antibody:  PENDING (04/29 0800) ?Rubella: 2.89 (10/10 1424) ?RPR: Non Reactive (02/15 [id number])  ?HBsAg: Negative (10/10 1424)  ?HIV: Non Reactive (02/15 [id number])  ?GBS: Negative/-- (04/05 0957)  ?1 hr Glucola: Normal ?Genetic screening:  Normal ?Anatomy US: Normal ? ?Prenatal Transfer Tool  ?Maternal Diabetes: Yes:  Diabetes Type:  Diet controlled ?Genetic Screening: Normal ?Maternal Ultrasounds/Referrals: Normal ?Fetal Ultrasounds or other Referrals:  None ?Maternal Substance Abuse:  No ?Significant Maternal Medications:  None ?Significant Maternal Lab Results: Group B Strep negative ? ?Results for orders placed or performed during the hospital encounter of 05/02/21 (from the past 24 hour(s))  ?CBC  ? Collection Time: 05/02/21  8:00 AM  ?Result Value Ref Range  ? WBC 8.9 4.0 - 10.5 K/uL  ? RBC 4.03 3.87 - 5.11 MIL/uL  ? Hemoglobin 10.8 (L) 12.0 - 15.0 g/dL  ? HCT 34.5 (L) 36.0 - 46.0 %  ? MCV 85.6 80.0 - 100.0 fL  ? MCH 26.8 26.0 - 34.0 pg  ? MCHC 31.3 30.0 - 36.0 g/dL  ? RDW 14.8 11.5 - 15.5 %  ? Platelets 198 150 - 400 K/uL  ? nRBC 0.0 0.0 - 0.2 %  ?Type and screen  ? Collection Time: 05/02/21  8:00 AM  ?Result Value Ref Range  ? ABO/RH(D) PENDING   ? Antibody Screen PENDING   ? Sample Expiration    ?  05/05/2021,2359 ?Performed at [facility], [place] [address], [place], [place] [address] ?  ?Glucose, capillary  ? Collection Time: 05/02/21  8:37 AM  ?Result Value Ref Range  ? Glucose-Capillary 94 70 - 99 mg/dL  ? ? ?Patient Active Problem List  ? Diagnosis Date Noted  ? Diet controlled gestational diabetes mellitus (GDM) in third trimester 05/02/2021  ? BMI 40.0-44.9, adult ([place]) 03/18/2021  ? Gestational diabetes mellitus (GDM) in childbirth, diet controlled 02/20/2021  ? Language barrier affecting health care 12/10/2020  ? Prediabetes 10/31/2020  ? Supervision of high-risk pregnancy 10/13/2020  ? Maternal morbid obesity, antepartum ([place]) 10/13/2020  ? ? ?Assessment/Plan:  ?Leah Garcia is a 26 y.o. G2P1001  at [redacted]w[redacted]d here for IOL d/t A12GM  ? ?#Labor: 1cm dilated and thick. Will start patient on Cytotec with plan to reassess in 3-4 hours. Depending on her progress will consider FB at that time.  ?#Pain: IV PRN, Epidural at pt request ?#FWB: Cat 1 ?#ID:  GBS Negative  ?#MOF: Breastfeeding  ?#MOC: Undecided  ?#Circ:  No ?#A1GDM: Monitor CBG Q4H and Q2H when in active labor ? ? ?Alen Bleacher, MD  ?Center for [facility] [place] ?05/02/2021, 8:43 AM ? ?  ?GME ATTESTATION:  ?  I saw and evaluated the patient. I agree with the findings and the plan of care as documented in the resident?s note and have made all necessary edits. ? ?Renard Matter, MD, MPH ?OB Fellow, Faculty Practice ?[place] for [facility] ?05/02/2021 12:52 PM ? ?

## 2021-05-02 NOTE — Progress Notes (Signed)
Labor Progress Note ?Thermon Leyland [name] Felipa Evener is a 26 y.o. G2P1001 at [redacted]w[redacted]d presented for IOL d/t A1GDM ?S: Patient is resting comfortably. Reports she's no longer feeling contractions. ? ?O:  ?BP 124/70   Pulse 77   Temp 97.9 ?F (36.6 ?C)   Resp 18   Ht 5\' 4"  (1.626 m)   Wt (!) 163.8 kg   LMP 07/02/2020   BMI 62.00 kg/m?  ?EFM: 120 bpm/ moderate Variability/+accels ? ?CVE: Dilation: 1 ?Effacement (%): 50 ?Cervical Position: Posterior ?Station: -3 ?Presentation: Vertex ?Exam by:: S Grindstaff RN ? ? ?A&P: 26 y.o. G2P1001 [redacted]w[redacted]d  ?#Labor: Progressing well. At 1cm dilated, gave a second dose of Cytotec and will reassess in 4 hours with plan for FB placement depending on progress.  ?#Pain: PRN, Epidural at request ?#FWB: Cat 1 ?#GBS negative ?#A1GDM: CBGs have remained stable. Will continue monitoring CBG Q4H. ? ?Alen Bleacher, MD ?Center for [place], [place] ?2:46 PM  ?

## 2021-05-02 NOTE — Progress Notes (Signed)
Monitor batteries low.  Monitors changed ?

## 2021-05-02 NOTE — Progress Notes (Signed)
Assessed patient at bedside. ? ?Appears comfortable.  Okay with trial of Cook's balloon placement with speculum at this time since just had cervical check less than 2 hours ago. ? ?Speculum used to visualize cervix.  Cook's balloon placed without difficulty with 60 cc of fluid in just the uterine balloon.  Tolerated well by fetus as well as patient.  Confirmed placement of the balloon inside cervix with digital exam after placement with speculum guidance. ? ?Patient not due for additional dose of Cytotec at this time.  Will assess balloon at time of next dose and decide whether we will get another dose of Cytotec or we will move on to Pitocin.  ? ?Patient in agreement with plan ? ?Warner Mccreedy, MD, MPH ?OB Fellow, Faculty Practice ? ?

## 2021-05-03 ENCOUNTER — Encounter (HOSPITAL_COMMUNITY): Payer: Self-pay | Admitting: Family Medicine

## 2021-05-03 DIAGNOSIS — O24420 Gestational diabetes mellitus in childbirth, diet controlled: Secondary | ICD-10-CM

## 2021-05-03 DIAGNOSIS — Z3A39 39 weeks gestation of pregnancy: Secondary | ICD-10-CM

## 2021-05-03 LAB — GLUCOSE, CAPILLARY
Glucose-Capillary: 114 mg/dL — ABNORMAL HIGH (ref 70–99)
Glucose-Capillary: 84 mg/dL (ref 70–99)

## 2021-05-03 MED ORDER — ONDANSETRON HCL 4 MG/2ML IJ SOLN: 4.0000 mg | INTRAMUSCULAR | Status: DC | PRN

## 2021-05-03 MED ORDER — DIPHENHYDRAMINE HCL 25 MG PO CAPS: 25.0000 mg | ORAL_CAPSULE | Freq: Four times a day (QID) | ORAL | Status: DC | PRN

## 2021-05-03 MED ORDER — TETANUS-DIPHTH-ACELL PERTUSSIS 5-2.5-18.5 LF-MCG/0.5 IM SUSY: 0.5000 mL | PREFILLED_SYRINGE | Freq: Once | INTRAMUSCULAR | Status: DC

## 2021-05-03 MED ORDER — ACETAMINOPHEN 325 MG PO TABS
650.0000 mg | ORAL_TABLET | ORAL | Status: DC | PRN
Start: 2021-05-03 — End: 2021-05-04
  Administered 2021-05-03: 650 mg via ORAL
  Filled 2021-05-03: qty 2

## 2021-05-03 MED ORDER — ONDANSETRON HCL 4 MG PO TABS: 4.0000 mg | ORAL_TABLET | ORAL | Status: DC | PRN

## 2021-05-03 MED ORDER — COCONUT OIL OIL
1.0000 "application " | TOPICAL_OIL | Status: DC | PRN
  Administered 2021-05-04: 1 via TOPICAL

## 2021-05-03 MED ORDER — WITCH HAZEL-GLYCERIN EX PADS: 1.0000 "application " | MEDICATED_PAD | CUTANEOUS | Status: DC | PRN

## 2021-05-03 MED ORDER — IBUPROFEN 600 MG PO TABS
600.0000 mg | ORAL_TABLET | Freq: Four times a day (QID) | ORAL | Status: DC
Start: 2021-05-03 — End: 2021-05-04
  Administered 2021-05-03 – 2021-05-04 (×5): 600 mg via ORAL
  Filled 2021-05-03 (×5): qty 1

## 2021-05-03 MED ORDER — BENZOCAINE-MENTHOL 20-0.5 % EX AERO: 1.0000 "application " | INHALATION_SPRAY | CUTANEOUS | Status: DC | PRN

## 2021-05-03 MED ORDER — SENNOSIDES-DOCUSATE SODIUM 8.6-50 MG PO TABS
2.0000 | ORAL_TABLET | ORAL | Status: DC
  Administered 2021-05-04: 2 via ORAL
  Filled 2021-05-03: qty 2

## 2021-05-03 MED ORDER — PRENATAL MULTIVITAMIN CH
1.0000 | ORAL_TABLET | Freq: Every day | ORAL | Status: DC
  Administered 2021-05-03 – 2021-05-04 (×2): 1 via ORAL
  Filled 2021-05-03 (×2): qty 1

## 2021-05-03 MED ORDER — SIMETHICONE 80 MG PO CHEW: 80.0000 mg | CHEWABLE_TABLET | ORAL | Status: DC | PRN

## 2021-05-03 MED ORDER — DIBUCAINE (PERIANAL) 1 % EX OINT: 1.0000 "application " | TOPICAL_OINTMENT | CUTANEOUS | Status: DC | PRN

## 2021-05-03 MED ORDER — ZOLPIDEM TARTRATE 5 MG PO TABS: 5.0000 mg | ORAL_TABLET | Freq: Every evening | ORAL | Status: DC | PRN

## 2021-05-03 NOTE — Progress Notes (Signed)
Labor Progress Note ?Leah Garcia is a 26 y.o. G2P1001 at [redacted]w[redacted]d presented for IOL due to GDM. ? ?S: Overall doing well. Ctx tolerable to her. Pit currently at 10.  ? ?O:  ?BP 107/72 (BP Location: Left Arm)   Pulse 70   Temp 98.5 ?F (36.9 ?C) (Oral)   Resp 18   Ht 5\' 4"  (1.626 m)   Wt (!) 163.8 kg   LMP 07/02/2020   BMI 62.00 kg/m?  ?EFM: 130/mod/15x15/none ? ?CVE: Dilation: 5 ?Effacement (%): 70 ?Cervical Position: Posterior ?Station: -3 ?Presentation: Vertex ?Exam by:: Dr. [email or web address] ? ? ?A&P: 26 y.o. G2P1001 [redacted]w[redacted]d  ?#Labor: Some progression since last check. Still posterior station but has come forward some with head applied to cervix. After verbal consent, performed AROM with moderate amount of light meconium stained fluid. Rechecked cervical exam after and head remained well applied. Patient tolerated well and immediately began to feel more pressure and now uncomfortable with contractions. Half pit to 5 and increase back if needed.  ?#Pain: PRN, planning without epidural  ?#FWB: Cat I-II with small variables with contractions occasionally but reassuring overall ?#GBS negative ? ?#GDM: CBGs appropriate.  ? ?[redacted]w[redacted]d, DO ?5:33 AM  ?

## 2021-05-03 NOTE — Progress Notes (Signed)
Vertis Kelch [name] ?MRN: [id number] ? ?Subjective: ?-Care assumed of 26 y.o. G2P1001 at [redacted]w[redacted]d who presents for IOL s/t GDM. In room to meet acquaintance of patient and family.  Patient coping with contractions with known late active phase.  ? ?Objective: ?BP (!) 125/46   Pulse (!) 102   Temp 98.5 ?F (36.9 ?C) (Oral)   Resp 18   Ht 5\' 4"  (1.626 m)   Wt (!) 163.8 kg   LMP 07/02/2020   BMI 62.00 kg/m?  ?No intake/output data recorded. ?No intake/output data recorded. ? ?Fetal Monitoring: ?FHT: 135 bpm, Mod Var, -Decels, -Accels ?UC: Palpates moderate   ? ?Vaginal Exam: ?SVE:   Dilation: Lip/rim ?Effacement (%): 90 ?Station: -2 ?Exam by:: [email or web address] ?Membranes:AROM x 1 hr ?Internal Monitors: None ? ?Augmentation/Induction: ?Pitocin:31mUn/min ?Cytotec: S/P ? ?Assessment:  ?IUP at 39.3 weeks ?Cat I FT  ?Induction ?Active Labor ? ? ?Plan: ?-Anticipate SVD ?-Provider to remain at bedside ? ? ?4m, CNM ?Valma Cava, [facility] ?05/03/2021, 6:57 AM ? ? ?

## 2021-05-03 NOTE — Discharge Summary (Signed)
? ?  Postpartum Discharge Summary ? ?   ?Patient Name: Leah Garcia [facility] ?DOB: 02-25-1995 ?MRN: [id number] ? ?Date of admission: 05/02/2021 ?Delivery date:05/03/2021  ?Delivering provider: Patriciaann Clan  ?Date of discharge: 05/04/2021 ? ?Admitting diagnosis: Diet controlled gestational diabetes mellitus (GDM) in third trimester [O24.410] ?Intrauterine pregnancy: [redacted]w[redacted]d     ?Secondary diagnosis:  Principal Problem: ?  Vaginal delivery ?Active Problems: ?  Supervision of high-risk pregnancy ?  Maternal morbid obesity, antepartum ([place]) ?  Prediabetes ?  Language barrier affecting health care ? ?Additional problems: none    ?Discharge diagnosis: Term Pregnancy Delivered and GDM A1                                              ?Post partum procedures: none ?Augmentation: AROM, Pitocin, Cytotec, and IP Foley ?Complications: None ? ?Hospital course: Induction of Labor With Vaginal Delivery   ?26 y.o. yo G2P1001 at [redacted]w[redacted]d was admitted to the hospital 05/02/2021 for induction of labor.  Indication for induction: A1 DM.  Patient had an uncomplicated labor course as follows: ?Membrane Rupture Time/Date: 5:19 AM ,05/03/2021   ?Delivery Method:Vaginal, Spontaneous  ?Episiotomy: None  ?Lacerations:  None  ?Details of delivery can be found in separate delivery note.  Patient had a routine postpartum course.  Her fasting CBG on PPD#1 was 97. Patient is discharged home 05/04/21 per her request for early d/c as long as the baby can go as well. ? ?Newborn Data: ?Birth date:05/03/2021  ?Birth time:7:19 AM  ?Gender:Female  ?Living status:Living  ?Apgars:9 ,9  ?Weight:3033 g (6lb 11oz) ? ?Magnesium Sulfate received: No ?BMZ received: No ?Rhophylac:N/A ?MMR:N/A ?T-DaP:Given prenatally ?Flu: Yes ?Transfusion:No ? ?Physical exam  ?Vitals:  ? 05/03/21 1447 05/03/21 1850 05/03/21 2214 05/04/21 0456  ?BP: 107/68 (!) 104/57 105/67 108/69  ?Pulse: 75 88 86 86  ?Resp: $Remov'18 18 18 16  'IDPBdU$ ?Temp: (!) 97.4 ?F (36.3 ?C) 98.1 ?F (36.7 ?C) 98.5 ?F (36.9  ?C) 98.5 ?F (36.9 ?C)  ?TempSrc: Oral  Oral Oral  ?SpO2: 98% 100% 100% 99%  ?Weight:      ?Height:      ? ?General: alert and cooperative ?Lochia: appropriate ?Uterine Fundus: firm ?Incision: N/A ?DVT Evaluation: No evidence of DVT seen on physical exam. ?Labs: ?Lab Results  ?Component Value Date  ? WBC 9.7 05/04/2021  ? HGB 9.4 (L) 05/04/2021  ? HCT 28.2 (L) 05/04/2021  ? MCV 83.4 05/04/2021  ? PLT 164 05/04/2021  ? ? ?  Latest Ref Rng & Units 10/31/2020  ?  9:41 AM  ?CMP  ?Glucose 70 - 99 mg/dL 85    ?BUN 6 - 20 mg/dL 5    ?Creatinine 0.57 - 1.00 mg/dL 0.62    ?Sodium 134 - 144 mmol/L 140    ?Potassium 3.5 - 5.2 mmol/L 3.9    ?Chloride 96 - 106 mmol/L 106    ?CO2 20 - 29 mmol/L 21    ?Calcium 8.7 - 10.2 mg/dL 8.6    ?Total Protein 6.0 - 8.5 g/dL 6.6    ?Total Bilirubin 0.0 - 1.2 mg/dL <0.2    ?Alkaline Phos 44 - 121 IU/L 67    ?AST 0 - 40 IU/L 21    ?ALT 0 - 32 IU/L 17    ? ?Edinburgh Score: ? ?  05/03/2021  ? 11:51 PM  ?Flavia Shipper Postnatal Depression Scale Screening Tool  ?  I have been able to laugh and see the funny side of things. 0  ?I have looked forward with enjoyment to things. 0  ?I have blamed myself unnecessarily when things went wrong. 0  ?I have been anxious or worried for no good reason. 0  ?I have felt scared or panicky for no good reason. 0  ?Things have been getting on top of me. 1  ?I have been so unhappy that I have had difficulty sleeping. 3  ?I have felt sad or miserable. 0  ?I have been so unhappy that I have been crying. 0  ?The thought of harming myself has occurred to me. 0  ?Edinburgh Postnatal Depression Scale Total 4  ? ? ? ?After visit meds:  ?Allergies as of 05/04/2021   ?No Known Allergies ?  ? ?  ?Medication List  ?  ? ?TAKE these medications   ? ?ibuprofen 600 MG tablet ?Commonly known as: ADVIL ?Take 1 tablet (600 mg total) by mouth every 6 (six) hours as needed. ?  ?PRENATAL VITAMIN PO ?Take 1 tablet by mouth daily. ?  ? ?  ? ? ? ?Discharge home in stable condition ?Infant Feeding:  Breast ?Infant Disposition:home with mother ?Discharge instruction: per After Visit Summary and Postpartum booklet. ?Activity: Advance as tolerated. Pelvic rest for 6 weeks.  ?Diet: routine diet ?Future Appointments:No future appointments. ?Follow up Visit: ? Follow-up Information   ? ? [place] Follow up.   ?Specialty: Obstetrics and Gynecology ?Why: You will be contacted with a postpartum appointment in 4-6 weeks ?Contact information: ?[address], [address] ?[place] ?[phone] ? ?  ?  ? ?  ?  ? ?  ? ? ? ?Please schedule this patient for a In person postpartum visit in  5 Weeks  with the following provider: Any provider. ?Additional Postpartum F/U:2 hour GTT  ?High risk pregnancy complicated by: GDM ?Delivery mode:  Vaginal, Spontaneous  ?Anticipated Birth Control:  Unsure ? ? ?05/04/2021 ?Myrtis Ser, CNM ?7:43 AM ? ? ? ? ?

## 2021-05-03 NOTE — Lactation Note (Signed)
This note was copied from a baby's chart. ?Lactation Consultation Note ? ?Patient Name: Leah Garcia ?Today's Date: 05/03/2021 ?Reason for consult: L&D Initial assessment ?Age:24 hours ? ?Spanish interpreter used.  Family spoke some [place]. ?P2, Ex BF.  Baby cueing.  Assisted with removing underwire bra and then baby latched with ease. ?Intermittent swallows observed. ?Lactation to follow up on MBU.  ? ?Maternal Data ?Does the patient have breastfeeding experience prior to this delivery?: Yes ?How long did the patient breastfeed?: 3 years ? ?Feeding ?Mother's Current Feeding Choice: Breast Milk ? ?LATCH Score ?Latch: Grasps breast easily, tongue down, lips flanged, rhythmical sucking. ? ?Audible Swallowing: A few with stimulation ? ?Type of Nipple: Everted at rest and after stimulation ? ?Comfort (Breast/Nipple): Soft / non-tender ? ?Hold (Positioning): Assistance needed to correctly position infant at breast and maintain latch. ? ?LATCH Score: 8 ? ? ?Interventions ?Interventions: Support pillows;Adjust position;Education ? ?Consult Status ?Consult Status: Follow-up from L&D ? ? ? ?Leah Garcia ?05/03/2021, 8:28 AM ? ? ? ?

## 2021-05-03 NOTE — Lactation Note (Signed)
This note was copied from a baby's chart. ?Lactation Consultation Note ? ?Patient Name: Leah Garcia ?Today's Date: 05/03/2021 ?Reason for consult: Initial assessment;Term ?Age:24 hours ? ?LC in to room for initial consult. Mother states infant has been latching well, no pain or discomfort. Mother latches independently, good sucking swallowing pattern. Baby slips into a shallow latch and observed slanted nipple. Encouraged to support neck and back, sandwich breast for depth. Reviewed hand expression, collected ~60mL and spoonfed. ?Reviewed normal newborn behavior during first 24h, expected output and feeding frequency. Mother requests a hand pump and LC brought one to room.  ? ?Plan: ?1-Skin to skin, aim for a deep, comfortable latch and breastfeed on demand or 8-12 times in 24h period. ?2-Encouraged maternal rest, hydration and food intake.  ?3-Contact LC as needed for feeds/support/concerns/questions ?  ?All questions answered at this time. Provided Lactation services brochure and promoted [employer] booklet information.   ? ?Maternal Data ?Has patient been taught Hand Expression?: Yes ?Does the patient have breastfeeding experience prior to this delivery?: Yes ?How long did the patient breastfeed?: 3 years ? ?Feeding ?Mother's Current Feeding Choice: Breast Milk ? ?LATCH Score ?Latch: Grasps breast easily, tongue down, lips flanged, rhythmical sucking. ? ?Audible Swallowing: A few with stimulation ? ?Type of Nipple: Everted at rest and after stimulation (short shafted) ? ?Comfort (Breast/Nipple): Soft / non-tender ? ?Hold (Positioning): Assistance needed to correctly position infant at breast and maintain latch. ? ?LATCH Score: 8 ? ? ?Lactation Tools Discussed/Used ?Tools: Pump;Flanges ?Flange Size: 24;21 ?Breast pump type: Manual ?Pump Education: Setup, frequency, and cleaning;Milk Storage ?Reason for Pumping: mother's request ?Pumping frequency: as needed ?Pumped volume:  (drops with  demonstration) ? ?Interventions ?Interventions: Breast feeding basics reviewed;Assisted with latch;Skin to skin;Breast massage;Hand express;Hand pump;Expressed milk;Adjust position;Education;LC Services brochure ? ?Discharge ?Pump: Manual ?WIC Program: Yes ? ?Consult Status ?Consult Status: Follow-up ?Date: 05/03/21 ?Follow-up type: In-patient ? ? ? ?Leah Garcia ?05/03/2021, 1:57 PM ? ? ? ?

## 2021-05-04 ENCOUNTER — Encounter (HOSPITAL_COMMUNITY): Payer: Self-pay | Admitting: Family Medicine

## 2021-05-04 LAB — CBC
HCT: 28.2 % — ABNORMAL LOW (ref 36.0–46.0)
Hemoglobin: 9.4 g/dL — ABNORMAL LOW (ref 12.0–15.0)
MCH: 27.8 pg (ref 26.0–34.0)
MCHC: 33.3 g/dL (ref 30.0–36.0)
MCV: 83.4 fL (ref 80.0–100.0)
Platelets: 164 10*3/uL (ref 150–400)
RBC: 3.38 MIL/uL — ABNORMAL LOW (ref 3.87–5.11)
RDW: 15.1 % (ref 11.5–15.5)
WBC: 9.7 10*3/uL (ref 4.0–10.5)
nRBC: 0 % (ref 0.0–0.2)

## 2021-05-04 LAB — GLUCOSE, CAPILLARY: Glucose-Capillary: 97 mg/dL (ref 70–99)

## 2021-05-04 MED ORDER — IBUPROFEN 600 MG PO TABS: 600.0000 mg | ORAL_TABLET | Freq: Four times a day (QID) | ORAL | 0 refills | Status: DC | PRN

## 2021-05-04 NOTE — Lactation Note (Addendum)
This note was copied from a baby's chart. ?Lactation Consultation Note ? ?Patient Name: Leah Garcia ?Today's Date: 05/04/2021 ?Reason for consult: Follow-up assessment;Maternal endocrine disorder;Term ?Age:24 hours ? ?Visited with mom of 29 hours old FT female, she's a P2 and experienced breastfeeding. She voiced that this baby is different than her last one (now 24 y.o) and complained that baby won't open his mouth wide enough to get a deep latch. Noticed that both nipples were sore, R bled a little when she tried using the hand pump and the L one had a positional stripe across the nipple.  ? ?MBU RN Leah Garcia brought coconut oil and comfort gels to mom (she's aware of not using them together) and LC assisted with hand expression and rubbed her EBM on her nipples. Offered assistance with latch but she politely declined, baby's last feeding was a large one (29 ml) and he was getting ready to have his hearing screen done, Nts in the room to work with baby. Asked mom to call for assistance when needed, offered key points for a deeper latch; [name] was appreciative. ? ?Mom and baby might be going home today. Reviewed discharge education, lactogenesis II, size of baby's stomach and pumping schedule/guidelines. ? ?Feeding ?Mother's Current Feeding Choice: Breast Milk and Formula ? ?Lactation Tools Discussed/Used ?Tools: Pump;Flanges ?Flange Size: 21;24 ?Breast pump type: Manual ?Pump Education: Setup, frequency, and cleaning;Milk Storage ?Reason for Pumping: mother's request ?Pumping frequency: PRN ?Pumped volume:  (drops) ? ?Interventions ?Interventions: Breast feeding basics reviewed;Breast massage;Hand express;Hand pump;Education ? ?Plan of care ?Encouraged mom to continue putting baby to breast 8-12 times/24 hours or sooner if feeding cues are present ?She'll start using coconut oil prior pumping, her EBM post-pumping/feeding and comfort gels in between feedings ?She'll pump whenever baby is getting  formula ?Parents will continue supplementing with Similac 20 calorie formula in the meantime, following formula supplementation guidelines according to baby's age in hours.  ? ?FOB present and supportive. All questions and concerns answered, family to contact [facility] services PRN. ? ?Discharge ?Discharge Education: Engorgement and breast care;Warning signs for feeding baby ?Pump: Manual ? ?Consult Status ?Consult Status: Complete ?Date: 05/04/21 ?Follow-up type: Call as needed ? ? ?Leah Garcia ?05/04/2021, 1:36 PM ? ? ? ?

## 2021-05-05 ENCOUNTER — Encounter (HOSPITAL_COMMUNITY): Payer: Self-pay | Admitting: Family Medicine

## 2021-05-11 ENCOUNTER — Telehealth (HOSPITAL_COMMUNITY): Payer: Self-pay | Admitting: *Deleted

## 2021-05-11 NOTE — Telephone Encounter (Signed)
Discharge follow-up phone call completed with [address] [place] #[phone]. Patient voiced no questions or concerns regarding her own health at this time. EPDS=1. ?Patient asked this RN, "Is it normal for the baby's eyes to look yellow?" This RN instructed patient to call infant's pediatrician in the morning. Patient stated that she has the phone number for the pediatrician and will call tomorrow. Patient voiced no other questions or concerns regarding infant at this time. Patient reports infant sleeps in a crib on his back. RN reviewed ABCs of safe sleep. Patient verbalized understanding. Deforest Hoyles, RN, 05/11/21, 1950   ?

## 2021-06-16 ENCOUNTER — Ambulatory Visit: Payer: Self-pay | Admitting: Obstetrics & Gynecology

## 2021-06-16 ENCOUNTER — Other Ambulatory Visit: Payer: Self-pay

## 2021-06-22 ENCOUNTER — Ambulatory Visit (INDEPENDENT_AMBULATORY_CARE_PROVIDER_SITE_OTHER): Payer: Self-pay | Admitting: Family Medicine

## 2021-06-22 DIAGNOSIS — Z3009 Encounter for other general counseling and advice on contraception: Secondary | ICD-10-CM

## 2021-06-22 MED ORDER — NORGESTIMATE-ETH ESTRADIOL 0.25-35 MG-MCG PO TABS: 1.0000 | ORAL_TABLET | Freq: Every day | ORAL | 11 refills | Status: DC

## 2021-06-22 NOTE — Progress Notes (Signed)
Post Partum Visit Note  Leah Garcia is a 26 y.o. [phone] female who presents for a postpartum visit. She is 7 weeks postpartum following a normal spontaneous vaginal delivery.  I have fully reviewed the prenatal and intrapartum course. The delivery was at 39.3 gestational weeks.  Anesthesia: none. Postpartum course has been uncomplicated. Baby is doing well. Baby is feeding by breast. Bleeding no bleeding. Bowel function is normal. Bladder function is normal. Patient is sexually active. Contraception method is none. Postpartum depression screening: negative.   The pregnancy intention screening data noted above was reviewed. Potential methods of contraception were discussed. The patient elected to proceed with OCPs   Edinburgh Postnatal Depression Scale - 06/22/21 1519       Edinburgh Postnatal Depression Scale:  In the Past 7 Days   I have been able to laugh and see the funny side of things. 0    I have looked forward with enjoyment to things. 0    I have blamed myself unnecessarily when things went wrong. 0    I have been anxious or worried for no good reason. 0    I have felt scared or panicky for no good reason. 0    Things have been getting on top of me. 0    I have been so unhappy that I have had difficulty sleeping. 0    I have felt sad or miserable. 0    I have been so unhappy that I have been crying. 0    The thought of harming myself has occurred to me. 0    Edinburgh Postnatal Depression Scale Total 0             Health Maintenance Due  Topic Date Due   COVID-19 Vaccine (1) Never done   FOOT EXAM  Never done   OPHTHALMOLOGY EXAM  Never done   URINE MICROALBUMIN  Never done   HPV VACCINES (1 - 2-dose series) Never done   TETANUS/TDAP  Never done   PAP-Cervical Cytology Screening  Never done   PAP SMEAR-Modifier  Never done   HEMOGLOBIN A1C  04/13/2021    The following portions of the patient's history were reviewed and updated as appropriate:  allergies, current medications, past family history, past medical history, past social history, past surgical history, and problem list.  Review of Systems Pertinent items are noted in HPI.  Objective:  BP 105/73   Pulse 75   Wt 239 lb (108.4 kg)   LMP 06/12/2021   Breastfeeding Yes   BMI 41.02 kg/m    General:  alert   Breasts:  not indicated  Lungs: Breathing comfortably in room air  Heart:  Normal pusles  Abdomen: soft, non-tender; bowel sounds normal; no masses,  no organomegaly   Wound N/A  GU exam:  not indicated       Assessment:    1. Postpartum care and examination Doing well. Pain normal. Bleeding is ok, had period (Patient's last menstrual period was 06/12/2021.)   2. General counseling and advice on female contraception Considering pills. Previously tried but couldn't remember which type of pill. Discussed POPs vs. OCPs. No prior history of blood clots or migraines with aura. Patient is weary about getting hormones. We discussed Paragard and condoms. Reports does not smoke. Last intercourse 2 days ago and prior to that a few weeks ago. -patient opts for OCPs. Will do UPT today and start quickstart and then recommended UPT in 2 weeks since was sexually active  2 days ago     Plan:   Essential components of care per ACOG recommendations:  1.  Mood and well being: Patient with negative depression screening today. Reviewed local resources for support.  - Patient tobacco use? No.   - hx of drug use? No.    2. Infant care and feeding:  -Patient currently breastmilk feeding? Yes. Reviewed importance of draining breast regularly to support lactation.  -Social determinants of health (SDOH) reviewed in EPIC. No concerns  3. Sexuality, contraception and birth spacing - Patient does not want a pregnancy in the next year.  - Reviewed forms of contraception in tiered fashion. Patient desired oral contraceptives (estrogen/progesterone) today.   - Discussed birth spacing  of 18 months  4. Sleep and fatigue -Encouraged family/partner/community support of 4 hrs of uninterrupted sleep to help with mood and fatigue  5. Physical Recovery  - Discussed patients delivery and complications. She describes her labor as good. - Patient had a Vaginal, no problems at delivery. Patient had a 1st degree laceration. Perineal healing reviewed. Patient expressed understanding - Patient has urinary incontinence? No. - Patient is safe to resume physical and sexual activity  6.  Health Maintenance - HM due items addressed Yes - Last pap smear No results found for: "DIAGPAP" Pap smear not done at today's visit. Referral to [facility] -Breast Cancer screening indicated? No.   7. Chronic Disease/Pregnancy Condition follow up: Did not get GTT for hx of GDM. Ordered. Patient to come back for lab visit in 1 week for PP GTT- scheduled on 06/24/2021  - PCP follow up  Warner Mccreedy, MD Center for [facility], [facility]

## 2021-06-24 ENCOUNTER — Other Ambulatory Visit: Payer: Self-pay

## 2021-06-24 DIAGNOSIS — O24420 Gestational diabetes mellitus in childbirth, diet controlled: Secondary | ICD-10-CM

## 2021-06-25 LAB — GLUCOSE TOLERANCE, 2 HOURS
Glucose, 2 hour: 102 mg/dL (ref 70–139)
Glucose, GTT - Fasting: 80 mg/dL (ref 70–99)

## 2021-09-29 ENCOUNTER — Ambulatory Visit: Payer: Self-pay

## 2022-01-04 NOTE — L&D Delivery Note (Signed)
OB/GYN Faculty Practice Delivery Note  Leah Garcia is a 27 y.o. [id number] s/p SVD at [redacted]w[redacted]d. She was admitted for IOL for post-dates.   ROM: 3h 2m with clear fluid GBS Status: Positive/-- (10/09 0150) Maximum Maternal Temperature: Temp (24hrs), Avg:98.4 F (36.9 C), Min:98.3 F (36.8 C), Max:98.5 F (36.9 C)   Labor Progress: Initial SVE: 3/60/-3. AROM and Pitocin required. She then progressed to complete.   Delivery Date/Time: 11/15/22 0835 Delivery: Called to room and patient was complete and pushing. Patient had excellent pushing efforts. Head began to crown with 2nd contraction of pushing. Head delivered LOA. Despite another 2 strong pushes by mom and gentle downward traction, shoulders did not delivery. Shoulder dystocia called. Rns placed patient in McRobert's, and patient was instructed to stop pushing. Anterior shoulder identified as right shoulder. Was able to deliver posterior arm (left) with two attempts. Total dystocia time approximately 50 seconds. No nuchal cord present. Infant with spontaneous cry, placed on mother's abdomen, dried and stimulated. Cord clamped x 2 after 1-minute delay, and cut by FOB. Cord blood drawn. Placenta delivered spontaneously with gentle cord traction. Fundus firm with massage and Pitocin. Labia, perineum, and vagina inspected with no lacerations.  Baby Weight: pending  Placenta: 3 vessel, intact. Sent to L&D Complications: Shoulder dystocia Lacerations: None EBL: 262 mL Anesthesia: none  Infant:  APGAR (1 MIN): 8  APGAR (5 MINS): 9  APGAR (10 MINS):    Joanne Gavel, MD [facility] Family Medicine Fellow, [facility]  [facility], [facility] 11/15/2022, 9:13 AM

## 2022-03-29 ENCOUNTER — Encounter (HOSPITAL_COMMUNITY): Payer: Self-pay

## 2022-03-29 ENCOUNTER — Inpatient Hospital Stay (HOSPITAL_COMMUNITY)
Admission: AD | Admit: 2022-03-29 | Discharge: 2022-03-29 | Disposition: A | Discharge status: Home / Self Care | Payer: Self-pay | Attending: Family Medicine | Admitting: Family Medicine

## 2022-03-29 DIAGNOSIS — M25511 Pain in right shoulder: Secondary | ICD-10-CM | POA: Insufficient documentation

## 2022-03-29 DIAGNOSIS — M2550 Pain in unspecified joint: Secondary | ICD-10-CM

## 2022-03-29 DIAGNOSIS — M25532 Pain in left wrist: Secondary | ICD-10-CM | POA: Insufficient documentation

## 2022-03-29 DIAGNOSIS — Z3A00 Weeks of gestation of pregnancy not specified: Secondary | ICD-10-CM | POA: Insufficient documentation

## 2022-03-29 DIAGNOSIS — M25512 Pain in left shoulder: Secondary | ICD-10-CM | POA: Insufficient documentation

## 2022-03-29 DIAGNOSIS — Z1152 Encounter for screening for COVID-19: Secondary | ICD-10-CM | POA: Insufficient documentation

## 2022-03-29 DIAGNOSIS — O0930 Supervision of pregnancy with insufficient antenatal care, unspecified trimester: Secondary | ICD-10-CM | POA: Insufficient documentation

## 2022-03-29 DIAGNOSIS — O99519 Diseases of the respiratory system complicating pregnancy, unspecified trimester: Secondary | ICD-10-CM | POA: Insufficient documentation

## 2022-03-29 DIAGNOSIS — J069 Acute upper respiratory infection, unspecified: Secondary | ICD-10-CM | POA: Insufficient documentation

## 2022-03-29 DIAGNOSIS — O26899 Other specified pregnancy related conditions, unspecified trimester: Secondary | ICD-10-CM | POA: Insufficient documentation

## 2022-03-29 DIAGNOSIS — R110 Nausea: Secondary | ICD-10-CM | POA: Insufficient documentation

## 2022-03-29 DIAGNOSIS — M25531 Pain in right wrist: Secondary | ICD-10-CM | POA: Insufficient documentation

## 2022-03-29 LAB — URINALYSIS, ROUTINE W REFLEX MICROSCOPIC
Bilirubin Urine: NEGATIVE
Glucose, UA: NEGATIVE mg/dL
Hgb urine dipstick: NEGATIVE
Ketones, ur: NEGATIVE mg/dL
Leukocytes,Ua: NEGATIVE
Nitrite: NEGATIVE
Protein, ur: NEGATIVE mg/dL
Specific Gravity, Urine: 1.023 (ref 1.005–1.030)
pH: 6 (ref 5.0–8.0)

## 2022-03-29 LAB — RESP PANEL BY RT-PCR (RSV, FLU A&B, COVID)  RVPGX2
Influenza A by PCR: NEGATIVE
Influenza B by PCR: NEGATIVE
Resp Syncytial Virus by PCR: NEGATIVE
SARS Coronavirus 2 by RT PCR: NEGATIVE

## 2022-03-29 LAB — POCT PREGNANCY, URINE: Preg Test, Ur: POSITIVE — AB

## 2022-03-29 LAB — SARS CORONAVIRUS 2 BY RT PCR: SARS Coronavirus 2 by RT PCR: NEGATIVE

## 2022-03-29 MED ORDER — ACETAMINOPHEN 500 MG PO TABS
1000.0000 mg | ORAL_TABLET | Freq: Four times a day (QID) | ORAL | Status: DC | PRN
  Administered 2022-03-29: 1000 mg via ORAL
  Filled 2022-03-29: qty 2

## 2022-03-29 MED ORDER — CAFFEINE 200 MG PO TABS: 200.0000 mg | ORAL_TABLET | Freq: Once | ORAL | Status: DC

## 2022-03-29 NOTE — MAU Provider Note (Signed)
History     CSN: [id number]  Arrival date and time: 03/29/22 1051   Event Date/Time   First Provider Initiated Contact with Patient 03/29/22 1143      Chief Complaint  Patient presents with   Joint Pain   Patient is a G3, P2 at unknown gestation presenting for viral URI symptoms including congestion, rhinorrhea, chills along with joint pain in her hands, wrists, shoulders, neck, knees, ankles bilaterally.  She also reports nausea.  Patient states that she is unsure how far along she is in her pregnancy.  Remembers that menstrual cycle in January but is not sure if she had 1 in February.  Reports that she took a single tablet of Tylenol yesterday around noon and 1 last night but has not taken anything since.  Is not sure what dose the tablets were.  Denies any vaginal bleeding, gush of fluid traction.   OB History     Gravida  3   Para  2   Term  2   Preterm      AB      Living  2      SAB      IAB      Ectopic      Multiple  0   Live Births  2           Past Medical History:  Diagnosis Date   Diverticulitis of colon 11/24/2019   Gestational diabetes    SIRS (systemic inflammatory response syndrome) ([place]) 11/25/2019    Past Surgical History:  Procedure Laterality Date   NO PAST SURGERIES      Family History  Problem Relation Age of Onset   Heart disease Neg Hx    Cancer Neg Hx    Diabetes Neg Hx    Hypertension Neg Hx     Social History   Tobacco Use   Smoking status: Never    Passive exposure: Never   Smokeless tobacco: Never  Vaping Use   Vaping Use: Never used  Substance Use Topics   Alcohol use: Not Currently    Alcohol/week: 5.0 standard drinks of alcohol    Types: 5 Cans of beer per week    Comment: 5 beers weekly, mostly on the weekend   Drug use: Never    Allergies: No Known Allergies  Medications Prior to Admission  Medication Sig Dispense Refill Last Dose   ibuprofen (ADVIL) 600 MG tablet Take 1 tablet (600 mg total) by  mouth every 6 (six) hours as needed. (Patient not taking: Reported on 06/22/2021) 30 tablet 0    norgestimate-ethinyl estradiol (ORTHO-CYCLEN) 0.25-35 MG-MCG tablet Take 1 tablet by mouth daily. 28 tablet 11    Prenatal Vit-Fe Fumarate-FA (PRENATAL VITAMIN PO) Take 1 tablet by mouth daily.       Review of Systems  Constitutional:  Positive for chills. Negative for fever.  HENT:  Positive for congestion and rhinorrhea.   Eyes:  Negative for visual disturbance.  Respiratory:  Negative for shortness of breath.   Gastrointestinal:  Positive for nausea. Negative for abdominal pain.  Endocrine: Negative for polyuria.  Genitourinary:  Negative for dysuria and vaginal bleeding.  Musculoskeletal:  Positive for arthralgias and joint swelling.   Physical Exam   Blood pressure 114/69, pulse 86, temperature 98.6 F (37 C), temperature source Oral, resp. rate 14, weight 106.9 kg, last menstrual period 06/12/2021, SpO2 100 %, currently breastfeeding.  Physical Exam Vitals reviewed.  HENT:     Head: Normocephalic.  Right Ear: External ear normal.     Left Ear: External ear normal.     Nose: Congestion and rhinorrhea present.     Mouth/Throat:     Mouth: Mucous membranes are moist.     Pharynx: No oropharyngeal exudate.  Eyes:     Extraocular Movements: Extraocular movements intact.  Cardiovascular:     Rate and Rhythm: Normal rate.  Pulmonary:     Effort: Pulmonary effort is normal. No respiratory distress.  Abdominal:     Palpations: Abdomen is soft.  Musculoskeletal:        General: No swelling (Patient reports that she feels like her hands are more swollen than normal.  No swelling on evaluation).     Right wrist: Tenderness present. No swelling. Normal range of motion.     Left wrist: Tenderness present. No swelling. Normal range of motion.     Cervical back: Normal range of motion.  Neurological:     Mental Status: She is alert.     MAU Course  Procedures  MDM COVID  test Flu test POC pregnancy test  Assessment and Plan  Birute Jurkovic [name]-Cartagena is a 27 yo G3P2002 at unknown gestation presenting with polyarthralgias, chills, congestion and rhinorrhea.  She reports that she is pregnant but is unsure of gestational age.  Denies any pregnancy symptoms or concerns.  Viral URI Signs and symptoms consistent with viral URI.  COVID test collected which was negative.  Flu test also collected.  Patient given 1000 mg Tylenol.  COVID test came back negative.  Flu test pending at the time of discharge.  Patient discharged home with strict return precautions.  No further questions or concerns.  No prenatal care Patient has not yet established for prenatal care.  Is interested in med Center for women but does not have insurance at this time.  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  Message sent to our clinic to have an initial prenatal visit scheduled.  No further questions or concerns regarding this.  Concepcion Living 03/29/2022, 12:24 PM

## 2022-03-29 NOTE — MAU Note (Signed)
.  Leah Garcia is a 27 y.o. at Unknown here in MAU reporting: has been feeling bad for the last few days with chills and pain in her joints. She also reports numbness and tingling in her hands. Denies VB or abdominal pain.   Pain score:  Vitals:   03/29/22 1112  BP: 114/69  Pulse: 86  Resp: 14  Temp: 98.6 F (37 C)  SpO2: 100%    Unsure of LMP.  Lab orders placed from triage:  UA, UPT

## 2022-03-29 NOTE — Discharge Instructions (Signed)
It was a pleasure taking care of you today.  I am sorry you are having this joint pain and these upper respiratory symptoms.  I believe it is likely due to a viral infection.  I want you to continue to take Tylenol as needed.  You can take up to 1000 mg every 8 hours.  If your symptoms worsen or you have any questions or concerns please return for further evaluation.  Regarding her prenatal care I will send a message to our med Center clinic and someone should call you to schedule an initial prenatal visit.  If you have any vaginal bleeding, gush of fluid, contractions please return to the MAU for evaluation.  I hope you have a wonderful afternoon!  Fue un placer atenderte [employer]. Lamento que Engineer, agricultural en las articulaciones y estos sntomas de las vas respiratorias superiores. Creo que probablemente se deba a una infeccin viral. Quiero que contine tomando Tylenol segn sea necesario. Puedes tomar hasta 1000 mg cada 8 horas. Si sus sntomas empeoran o tiene alguna pregunta o inquietud, regrese para Educational psychologist. Con respecto a su atencin prenatal, enviar un mensaje a nuestra clnica [facility] y alguien debera llamarla para programar una visita prenatal inicial. Si tiene algn sangrado vaginal, chorro de lquido o contracciones, regrese a MAU para su evaluacin. Espero que tengas una tarde [place]!

## 2022-05-11 ENCOUNTER — Other Ambulatory Visit: Payer: Self-pay

## 2022-05-11 ENCOUNTER — Ambulatory Visit (INDEPENDENT_AMBULATORY_CARE_PROVIDER_SITE_OTHER): Payer: Self-pay

## 2022-05-11 ENCOUNTER — Telehealth: Payer: Self-pay

## 2022-05-11 VITALS — BP 107/70 | HR 82

## 2022-05-11 DIAGNOSIS — Z3480 Encounter for supervision of other normal pregnancy, unspecified trimester: Secondary | ICD-10-CM | POA: Insufficient documentation

## 2022-05-11 DIAGNOSIS — O3680X0 Pregnancy with inconclusive fetal viability, not applicable or unspecified: Secondary | ICD-10-CM

## 2022-05-11 NOTE — Telephone Encounter (Signed)
Called Pt using Spanish [employer] Interpreter Leah Garcia id# [id number] to inform her that she has a dating [place] on 05/14/22 @ 1:30p at [employer]. Pt verbalized understanding.

## 2022-05-11 NOTE — Progress Notes (Signed)
New OB Intake  I connected with Leah Garcia  on 05/11/22 at 11:15 AM EDT by In Person Visit and verified that I am speaking with the correct person using two identifiers. Nurse is located at [facility] and pt is located at [facility].  I discussed the limitations, risks, security and privacy concerns of performing an evaluation and management service by telephone and the availability of in person appointments. I also discussed with the patient that there may be a patient responsible charge related to this service. The patient expressed understanding and agreed to proceed.  I explained I am completing New OB Intake today. We discussed EDD of  that is based on LMP of Pt. Not sure of LMP. Pt is G3/P2. I reviewed her allergies, medications, Medical/Surgical/OB history, and appropriate screenings. I informed her of [facility] services. [facility] information placed in AVS. Based on history, this is a low risk pregnancy.  Patient Active Problem List   Diagnosis Date Noted   Supervision of other normal pregnancy, antepartum 05/11/2022   Vaginal delivery 05/03/2021   Diet controlled gestational diabetes mellitus (GDM) in third trimester 05/02/2021   BMI 40.0-44.9, adult (HCC) 03/18/2021   Gestational diabetes mellitus (GDM) in childbirth, diet controlled 02/20/2021   Language barrier affecting health care 12/10/2020   Prediabetes 10/31/2020   Maternal morbid obesity, antepartum (HCC) 10/13/2020    Concerns addressed today  Delivery Plans Plans to deliver at [facility]. Patient given information for [facility] Healthy Baby website for more information about Women's and [facility] Center. Patient is not interested in water birth. Offered upcoming OB visit with CNM to discuss further.  MyChart/Babyscripts MyChart access verified. I explained pt will have some visits in office and some virtually. Babyscripts instructions given and order placed. Patient verifies receipt of registration text/e-mail. Account  successfully created and app downloaded.  Blood Pressure Cuff/Weight Scale Patient is self-pay; explained patient will be given BP cuff at first prenatal appt. Explained after first prenatal appt pt will check weekly and document in Babyscripts. Patient does have weight scale.  Anatomy US Explained first scheduled [place] will be around 19 weeks. Dating [place] scheduled for 05/14/22 at 0145p. Pt notified to arrive at 0130p.  Labs Discussed Avelina Laine genetic screening with patient. Would like both Panorama and Horizon drawn at new OB visit. Routine prenatal labs needed.  COVID Vaccine Patient has not had COVID vaccine.   Is patient a CenteringPregnancy candidate?  No current Spanish Grp   Is patient a Mom+Baby Combined Care candidate?  Not a candidate     Social Determinants of Health Food Insecurity: Patient denies food insecurity. WIC Referral: Patient is interested in referral to [facility].  Transportation: Patient denies transportation needs. Childcare: Discussed no children allowed at ultrasound appointments. Offered childcare services; patient declines childcare services at this time.  Interested in [place]? If yes, send referral and doula dot phrase.   First visit review I reviewed new OB appt with patient. Explained pt will be seen by Dr.Eckstat at first visit; encounter routed to appropriate provider. Explained that patient will be seen by pregnancy navigator following visit with provider.   Henrietta Dine, CMA 05/11/2022  1:59 PM

## 2022-05-14 ENCOUNTER — Ambulatory Visit (HOSPITAL_COMMUNITY): Payer: Self-pay

## 2022-05-17 ENCOUNTER — Other Ambulatory Visit: Payer: Self-pay | Admitting: Emergency Medicine

## 2022-05-17 ENCOUNTER — Other Ambulatory Visit (HOSPITAL_COMMUNITY)
Admission: RE | Admit: 2022-05-17 | Discharge: 2022-05-17 | Disposition: A | Discharge status: Home / Self Care | Payer: Medicaid Other | Source: Ambulatory Visit | Attending: Family Medicine | Admitting: Family Medicine

## 2022-05-17 ENCOUNTER — Ambulatory Visit (HOSPITAL_COMMUNITY)
Admission: RE | Admit: 2022-05-17 | Discharge: 2022-05-17 | Disposition: A | Discharge status: Home / Self Care | Payer: Medicaid Other | Source: Ambulatory Visit | Attending: Family Medicine | Admitting: Family Medicine

## 2022-05-17 DIAGNOSIS — Z3480 Encounter for supervision of other normal pregnancy, unspecified trimester: Secondary | ICD-10-CM

## 2022-05-17 DIAGNOSIS — O0281 Inappropriate change in quantitative human chorionic gonadotropin (hCG) in early pregnancy: Secondary | ICD-10-CM | POA: Insufficient documentation

## 2022-05-17 DIAGNOSIS — Z3A15 15 weeks gestation of pregnancy: Secondary | ICD-10-CM | POA: Insufficient documentation

## 2022-05-17 DIAGNOSIS — O3680X0 Pregnancy with inconclusive fetal viability, not applicable or unspecified: Secondary | ICD-10-CM | POA: Insufficient documentation

## 2022-05-17 DIAGNOSIS — O322XX0 Maternal care for transverse and oblique lie, not applicable or unspecified: Secondary | ICD-10-CM | POA: Diagnosis not present

## 2022-05-17 LAB — HCG, QUANTITATIVE, PREGNANCY: hCG, Beta Chain, Quant, S: 20933 m[IU]/mL — ABNORMAL HIGH (ref <5)

## 2022-05-18 ENCOUNTER — Other Ambulatory Visit: Payer: Self-pay

## 2022-05-18 ENCOUNTER — Encounter: Payer: Self-pay | Admitting: Family Medicine

## 2022-05-18 ENCOUNTER — Ambulatory Visit (INDEPENDENT_AMBULATORY_CARE_PROVIDER_SITE_OTHER): Payer: Self-pay | Admitting: Family Medicine

## 2022-05-18 VITALS — BP 103/71 | HR 82 | Wt 228.5 lb

## 2022-05-18 DIAGNOSIS — Z3482 Encounter for supervision of other normal pregnancy, second trimester: Secondary | ICD-10-CM | POA: Diagnosis not present

## 2022-05-18 DIAGNOSIS — J302 Other seasonal allergic rhinitis: Secondary | ICD-10-CM

## 2022-05-18 DIAGNOSIS — Z3A15 15 weeks gestation of pregnancy: Secondary | ICD-10-CM | POA: Diagnosis not present

## 2022-05-18 DIAGNOSIS — Z758 Other problems related to medical facilities and other health care: Secondary | ICD-10-CM

## 2022-05-18 DIAGNOSIS — Z8632 Personal history of gestational diabetes: Secondary | ICD-10-CM

## 2022-05-18 DIAGNOSIS — Z3009 Encounter for other general counseling and advice on contraception: Secondary | ICD-10-CM

## 2022-05-18 DIAGNOSIS — Z603 Acculturation difficulty: Secondary | ICD-10-CM

## 2022-05-18 DIAGNOSIS — Z3480 Encounter for supervision of other normal pregnancy, unspecified trimester: Secondary | ICD-10-CM

## 2022-05-18 DIAGNOSIS — O99210 Obesity complicating pregnancy, unspecified trimester: Secondary | ICD-10-CM

## 2022-05-18 MED ORDER — ASPIRIN 81 MG PO TBEC: 162.0000 mg | DELAYED_RELEASE_TABLET | Freq: Every day | ORAL | 11 refills | Status: DC

## 2022-05-18 MED ORDER — LORATADINE 10 MG PO TABS: 10.0000 mg | ORAL_TABLET | Freq: Every day | ORAL | 3 refills | Status: DC

## 2022-05-18 NOTE — Progress Notes (Signed)
Subjective:   Leah Leah is a 27 y.o. G3P2002 at [redacted]w[redacted]d by midtrimester ultrasound being seen today for her first obstetrical visit.  Her obstetrical history is significant for obesity and history of early gestational diabetes . Patient does intend to breast feed. Pregnancy history fully reviewed.  Patient reports  some red bumps on her arm that are now hyperpigmented .  HISTORY: OB History  Gravida Para Term Preterm AB Living  3 2 2  0 0 2  SAB IAB Ectopic Multiple Live Births  0 0 0 0 2    # Outcome Date GA Lbr Len/2nd Weight Sex Delivery Anes PTL Lv  3 Current           2 Term 05/03/21 [redacted]w[redacted]d  6 lb 11 oz (3.033 kg) M Vag-Spont None  LIV     Name: Leah Leah     Apgar1: 9  Apgar5: 9  1 Term 08/01/16   8 lb 5 oz (3.771 kg) F Vag-Spont EPI N LIV     Last pap smear: No results found for: "DIAGPAP", "HPV", "HPVHIGH" *needs*  Past Medical History:  Diagnosis Date   Diverticulitis of colon 11/24/2019   Gestational diabetes    SIRS (systemic inflammatory response syndrome) (HCC) 11/25/2019   Past Surgical History:  Procedure Laterality Date   NO PAST SURGERIES     Family History  Problem Relation Age of Onset   Heart disease Neg Hx    Cancer Neg Hx    Diabetes Neg Hx    Hypertension Neg Hx    Social History   Tobacco Use   Smoking status: Never    Passive exposure: Never   Smokeless tobacco: Never  Vaping Use   Vaping Use: Never used  Substance Use Topics   Alcohol use: Not Currently    Alcohol/week: 5.0 standard drinks of alcohol    Types: 5 Cans of beer per week    Comment: 5 beers weekly, mostly on the weekend   Drug use: Never   No Known Allergies Current Outpatient Medications on File Prior to Visit  Medication Sig Dispense Refill   Prenatal Vit-Fe Fumarate-FA (PRENATAL VITAMIN PO) Take 1 tablet by mouth daily.     ibuprofen (ADVIL) 600 MG tablet Take 1 tablet (600 mg total) by mouth every 6 (six) hours as needed.  (Patient not taking: Reported on 06/22/2021) 30 tablet 0   norgestimate-ethinyl estradiol (ORTHO-CYCLEN) 0.25-35 MG-MCG tablet Take 1 tablet by mouth daily. (Patient not taking: Reported on 05/11/2022) 28 tablet 11   No current facility-administered medications on file prior to visit.     Exam   Vitals:   05/18/22 1002  BP: 103/71  Pulse: 82  Weight: 228 lb 8 oz (103.6 kg)   Fetal Heart Rate (bpm): 160  System: General: well-developed, well-nourished female in no acute distress   Skin: normal coloration and turgor, no rashes   Neurologic: oriented, normal, negative, normal mood   Extremities: normal strength, tone, and muscle mass, ROM of all joints is normal   HEENT PERRLA, extraocular movement intact and sclera clear, anicteric   Neck supple and no masses   Respiratory:  no respiratory distress      Assessment:   Pregnancy: [id number] Patient Active Problem List   Diagnosis Date Noted   Supervision of other normal pregnancy, antepartum 05/11/2022   History of gestational diabetes 05/02/2021   Language barrier affecting health care 12/10/2020   Prediabetes 10/31/2020   Maternal morbid obesity, antepartum (HCC)  10/13/2020     Plan:  1. Supervision of other normal pregnancy, antepartum BP and FHR normal Pap deferred to postpartum BCCCP referral Spots on arm are post inflammatory hyperpigmentation, reassured they are benign Initial labs drawn. Continue prenatal vitamins. Genetic Screening discussed, NIPS: ordered. Panorama only, Horizon neg 4/4 in prior pregnancy AFP today Ultrasound discussed; fetal anatomic survey: ordered. Problem list reviewed and updated. The nature of [facility] with multiple MDs and other Advanced Practice Providers was explained to patient; also emphasized that residents, students are part of our team.  2. History of gestational diabetes During last pregnancy new OB A1c was 6%, had GDMA1 last pregnancy and  passed postpartum 2hr GTT A1c today  3. Language barrier affecting health care Spanish  4. Maternal morbid obesity, antepartum (HCC)   5. Unwanted fertility Would like BTL, discussed this is not possible for financial reasons Suggested post placental IUD and obtaining IUD through grant so that it would be free, she will consider this option  6. Seasonal allergies Trial claritin  Routine obstetric precautions reviewed. Return in 4 weeks (on 06/15/2022) for [facility], ob visit.

## 2022-05-18 NOTE — Patient Instructions (Addendum)
Las medicinas seguras para tomar Academic librarian  Safe Medications in Pregnancy  Acn:  Benzoyl Peroxide (Perxido de benzolo)  Salicylic Acid (cido saliclico)  Dolor de espalda/Dolor de cabeza:  Tylenol: 2 pastillas de concentracin regular cada 4 horas O 2 pastillas de concentracin fuerte cada 6 horas  Resfriados/Tos/Alergias:  Benadryl (sin alcohol) 25 mg cada 6 horas segn lo necesite Breath Right strips (Tiras para respirar correctamente)  Claritin  Cepacol (pastillas de chupar para la garganta)  Chloraseptic (aerosol para la garganta)  Cold-Eeze- hasta tres veces por da  Cough drops (pastillas de chupar para la tos, sin alcohol)  Flonase (con receta mdica solamente)  Guaifenesin  Mucinex  Robitussin DM (simple solamente, sin alcohol)  Saline nasal spray/drops (Aerosol nasal salino/gotas) Sudafed (pseudoephedrine) y  Actifed * utilizar slo despus de 12 semanas de gestacin y si no tiene la presin arterial alta.  Tylenol Vicks  VapoRub  Zinc lozenges (pastillas para la garganta)  Zyrtec  Estreimiento:  Colace  Ducolax (supositorios)  Fleet enema (lavado intestinal rectal)  Glycerin (supositorios)  Metamucil  Milk of magnesia (leche de magnesia)  Miralax  Senokot  Smooth Move (t)  Diarrea:  Kaopectate Imodium A-D  *NO tome Pepto-Bismol  Hemorroides:  Anusol  Anusol HC  Preparation H  Tucks  Indigestin:  Tums  Maalox  Mylanta  Zantac  Pepcid  Insomnia:  Benadryl (sin alcohol) 25mg  cada 6 horas segn lo necesite  Tylenol PM  Unisom, no Gelcaps  Calambres en las piernas:  Tums  MagGel Nuseas/Vmitos:  Bonine  Dramamine  Emetrol  Ginger (extracto)  Sea-Bands  Meclizine  Medicina para las nuseas que puede tomar durante el embarazo: Unisom (doxylamine succinate, pastillas de 25 mg) Tome una pastilla al da al [place]. Si los sntomas no estn adecuadamente controlados, la dosis puede aumentarse hasta una dosis mxima recomendada de American International Group al da (1/2 pastilla por la [place], 1/2 pastilla a media tarde y Neomia Dear pastilla al [place]). Pastillas de Vitamina B6 de 100mg . Tome ConAgra Foods veces al da (hasta 200 mg por da).  Erupciones en la piel:  Productos de Aveeno  Benadryl cream (crema o una dosis de 25mg  cada 6 horas segn lo necesite)  Calamine Lotion (locin)  1% cortisone cream (crema de cortisona de 1%)  nfeccin vaginal por hongos (candidiasis):  Gyne-lotrimin 7  Monistat 7   **Si est tomando varias medicinas, por favor revise las etiquetas para Art gallery manager los mismos ingredientes [place]. **Tome la medicina segn lo indicado en la etiqueta. **No tome ms de 400 mg de Tylenol en 24 horas. **No tome medicinas que contengan aspirina o ibuprofeno.       Tercer trimestre de Psychiatrist Third Trimester of Pregnancy  El tercer trimestre de embarazo va desde la semana 28 hasta la semana 40. Esto corresponde a los meses 7 a 9. El tercer trimestre es un perodo en el que el beb en gestacin (feto) crece rpidamente. Hacia el final del noveno mes, el feto mide alrededor de 20 pulgadas (45 cm) de largo y pesa entre 6 y 10 libras (2.7 y 4.5 kg). Cambios en el cuerpo durante el tercer trimestre Durante el tercer trimestre, su cuerpo contina experimentando numerosos cambios. Los cambios varan y generalmente vuelven a la normalidad despus del nacimiento del beb. Cambios fsicos Seguir American Standard Companies. Es de esperar que aumente entre 25 y 35 libras (11 y 16 kg) Psychiatric nurse final del embarazo si inicia el embarazo con un peso normal. Si tiene  bajo peso, es de esperar que aumente entre 28 y 40 libras (13 y 18 kg), y si tiene sobrepeso, es de esperar que aumente entre 15 y 25 libras (7 y 11 kg). Podrn aparecer las primeras Albertson's caderas, el abdomen y las Farber. Las ConAgra Foods seguirn creciendo y Writer. Un lquido amarillo Charity fundraiser) puede salir de sus pechos. Esta es la primera leche que usted produce  para su beb. Tal vez haya cambios en el cabello. Esto cambios pueden incluir su engrosamiento, crecimiento rpido y Allied Waste Industries textura. A algunas personas tambin se les cae el cabello durante o despus del [place], o tienen el cabello seco o fino. El ombligo puede salir hacia afuera. Puede observar que se le Eli Lilly and Company, el rostro o los tobillos. Cambios en la salud Es posible que tenga acidez estomacal. Puede sufrir estreimiento. Puede desarrollar hemorroides. Puede desarrollar venas hinchadas y abultadas (venas varicosas) en las piernas. Puede presentar ms dolor en la pelvis, la espalda o los muslos. Esto se debe al Citigroup de peso y al aumento de las hormonas que relajan las articulaciones. Puede presentar un aumento del hormigueo o entumecimiento en las manos, brazos y piernas. La piel de su abdomen tambin puede sentirse entumecida. Puede sentir que le falta el aire debido a que se expande el tero. Otros cambios Puede tener necesidad de Geographical information systems officer con ms frecuencia porque el feto baja hacia la pelvis y ejerce presin sobre la vejiga. Puede tener ms problemas para dormir. Esto puede deberse al tamao de su abdomen, una mayor necesidad de orinar y un aumento en el metabolismo de su cuerpo. Puede notar que el feto "baja" o lo siente ms bajo, en el abdomen (aligeramiento). Puede tener un aumento de la secrecin vaginal. Puede notar que tiene dolor alrededor del hueso plvico a medida que el tero se distiende. Siga estas instrucciones en su casa: Medicamentos Siga las instrucciones del mdico en relacin con el uso de medicamentos. Durante el embarazo, hay medicamentos que pueden tomarse y otros que no. No tome ningn medicamento a menos que lo haya autorizado el mdico. Tome vitaminas prenatales que contengan por lo menos 600 microgramos (mcg) de cido flico. Comida y bebida Lleve una dieta saludable que incluya frutas y verduras frescas, cereales integrales, buenas fuentes de  protenas como carnes Red Lion, huevos o tofu, y productos lcteos descremados. Evite la carne cruda y el [place], la [place] y el queso sin Market researcher. Estos portan grmenes que pueden provocar dao tanto a usted como al beb. Tome 4 o 5 comidas pequeas en lugar de 3 comidas abundantes al da. Es posible que tenga que tomar estas medidas para prevenir o tratar el estreimiento: Product manager suficiente lquido como para Pharmacologist la orina de color amarillo plido. Consumir alimentos ricos en fibra, como frijoles, cereales integrales, y frutas y verduras frescas. Limitar el consumo de alimentos ricos en grasa y azcares procesados, como los alimentos fritos o dulces. Actividad Haga ejercicio solamente como se lo haya indicado el mdico. La mayora de las personas pueden continuar su actividad fsica habitual durante el [place]. Intente realizar como mnimo 30 minutos de actividad fsica por lo menos 5 das a la [place]. Deje de hacer ejercicio si experimenta contracciones en el tero. Deje de hacer ejercicio si le aparecen dolor o clicos en la parte baja del vientre o de la espalda. Evite levantar pesos Fortune Brands. No haga ejercicio si hace mucho calor o humedad, o si se encuentra a una altitud elevada. Si lo desea, puede  seguir teniendo [employer] [place] Travelers, salvo que el mdico le indique lo contrario. Alivio del dolor y del [place] pausas frecuentes y descanse con las piernas levantadas (elevadas) si tiene calambres en las piernas o dolor en la parte baja de la espalda. Dese baos de asiento con agua tibia para Engineer, materials o las molestias causadas por las hemorroides. Use una crema para las hemorroides si el mdico la autoriza. Use un sujetador que le brinde buen soporte para prevenir las molestias causadas por la sensibilidad en las [place]. Si tiene venas varicosas: Use medias de compresin como se lo haya indicado el mdico. Eleve los pies durante 15 minutos, 3 o 4 veces por da. Limite el consumo  de sal en su dieta. Seguridad Hable con su mdico antes de viajar distancias largas. No se d baos de inmersin en agua caliente, baos turcos ni saunas. Use el cinturn de seguridad en todo momento mientras conduce o va en auto. Hable con el mdico si es vctima de Gen[employer]ine Parts o fsico. Preparacin para el nacimiento Para prepararse para la llegada de su beb: Tome clases prenatales para entender, Education administrator, y hacer preguntas sobre el [place] de parto y [place]. Visite el hospital y recorra el rea de maternidad. Compre un asiento de seguridad FirstEnergy Corp, y asegrese de saber cmo instalarlo en su automvil. Prepare la habitacin o el lugar donde dormir el beb. Asegrese de quitar todas las almohadas y [place] de peluche de la cuna del beb para evitar la asfixia. Indicaciones generales Evite el contacto con las bandejas sanitarias de los gatos y la tierra que estos animales usan. Estos alimentos contienen bacterias que pueden causar defectos congnitos en el beb. Si tiene Financial controller, pdale a alguien que limpie la caja de arena por usted. No se haga lavados vaginales ni use tampones. No use toallas higinicas perfumadas. No consuma ningn producto que contenga nicotina o tabaco, como cigarrillos, cigarrillos electrnicos y tabaco de Theatre manager. Si necesita ayuda para dejar de consumir estos productos, consulte al mdico. No use ningn remedio a base de hierbas, drogas ilegales o medicamentos que no le hayan sido recetados. Las sustancias qumicas de estos productos pueden daar al beb. No beba alcohol. Le realizarn exmenes prenatales ms frecuentes durante el tercer trimestre. Durante una visita prenatal de rutina, el mdico le har un examen fsico, Civil engineer, contracting pruebas y Heritage manager con usted de su salud general. Cumpla con todas las visitas de seguimiento. Esto es importante. Dnde buscar ms informacin American Pregnancy Association (Asociacin Estadounidense del Embarazo):  americanpregnancy.org Celanese Corporation of Obstetricians and Gynecologists (Colegio Estadounidense de Obstetras y Center [place]): EmploymentAssurance.cz? Office on Pitney Bowes (Oficina para la Salud de la Mujer): MightyReward.co.nz Comunquese con un mdico si tiene: Teacher, English as a foreign language. Clicos leves en la pelvis, presin en la pelvis o dolor persistente en la zona abdominal o la parte baja de la espalda. Vmitos o diarrea. Secrecin vaginal con mal olor u orina con mal olor. Dolor al Beatrix Shipper. Un dolor de cabeza que no desaparece despus de Science writer. Cambios en la visin o ve manchas delante de los ojos. Solicite ayuda de inmediato si: Rompe la bolsa. Tiene contracciones regulares separadas por menos de 5 minutos. Tiene sangrado o pequeas prdidas vaginales. Siente un dolor abdominal intenso. Tiene dificultad para respirar. Siente dolor en el pecho. Sufre episodios de Baxter International. No ha sentido a su beb moverse durante el perodo de Sempra Energy indic el mdico. Tiene dolor, hinchazn o enrojecimiento nuevos en un brazo o una pierna o  se produce un aumento de alguno de estos sntomas. Resumen El tercer trimestre del Psychiatrist comprende desde la semana 28 hasta la semana 40 (desde el mes 7 hasta el mes 9). Puede tener ms problemas para dormir. Esto puede deberse al tamao de su abdomen, una mayor necesidad de orinar y un aumento en el metabolismo de su cuerpo. Le realizarn exmenes prenatales ms frecuentes durante el tercer trimestre. Cumpla con todas las visitas de seguimiento. Esto es importante. Esta informacin no tiene Theme park manager el consejo del mdico. Asegrese de hacerle al mdico cualquier pregunta que tenga. Document Revised: 06/29/2019 Document Reviewed: 06/29/2019 Elsevier Patient Education  2023 Elsevier Inc.  Southern Company del mtodo anticonceptivo Contraception Choices La anticoncepcin, o los mtodos anticonceptivos, hace referencia a los mtodos o  dispositivos que evitan el [place]. Mtodos hormonales  Implante anticonceptivo Un implante anticonceptivo consiste en un tubo delgado de plstico que contiene una hormona que evita el [place]. Es diferente de un dispositivo intrauterino (DIU). Un mdico lo inserta en la parte superior del brazo. Los implantes pueden ser eficaces durante un mximo de 3 aos. Inyecciones de progestina sola Las inyecciones de progestina sola contienen progestina, una forma sinttica de la hormona progesterona. Un mdico las administra cada 3 meses. Pldoras anticonceptivas Las pldoras anticonceptivas son pastillas que contienen hormonas que evitan el [place]. Deben tomarse una vez al da, preferentemente a la misma Economist. Se necesita una receta para utilizar este mtodo anticonceptivo. Parche anticonceptivo El parche anticonceptivo contiene hormonas que evitan el [place]. Se coloca en la piel, debe cambiarse una vez a la semana durante tres semanas y debe retirarse en la cuarta semana. Se necesita una receta para utilizar este mtodo anticonceptivo. Anillo vaginal Un anillo vaginal contiene hormonas que evitan el embarazo. Se coloca en la vagina durante tres semanas y se retira en la cuarta semana. Luego se repite el proceso con un anillo nuevo. Se necesita una receta para utilizar este mtodo anticonceptivo. Anticonceptivo de emergencia Los anticonceptivos de emergencia son mtodos para evitar un embarazo despus de Warehouse manager sexo sin proteccin. Vienen en forma de pldora y pueden tomarse hasta 5 das despus de [place]. Funcionan mejor cuando se toman lo ms pronto posible luego de eBay. La mayora de los anticonceptivos de emergencia estn disponibles sin receta mdica. Este mtodo no debe utilizarse como el nico mtodo anticonceptivo. Mtodos de barrera  Condn masculino Un condn masculino es una vaina delgada que se coloca sobre el pene durante el sexo. Los condones evitan que el esperma  ingrese en el cuerpo de la [place]. Pueden utilizarse con un una sustancia que mata a los espermatozoides (espermicida) para aumentar la efectividad. Deben desecharse despus de un uso. Condn femenino Un condn femenino es una vaina blanda y holgada que se coloca en la vagina antes de [place]. El condn evita que el esperma ingrese en el cuerpo de la [place]. Deben desecharse despus de un uso. Diafragma Un diafragma es una barrera blanda con forma de cpula. Se inserta en la vagina antes del sexo, junto con un espermicida. El diafragma bloquea el ingreso de esperma en el tero, y el espermicida mata a los espermatozoides. El Designer, fashion/clothing en la vagina durante 6 a 8 horas despus de Warehouse manager sexo y debe retirarse en el plazo de las 24 horas. Un diafragma es recetado y colocado por un mdico. Debe reemplazarse cada 1 a 2 aos, despus de dar a luz, de aumentar ms de 15 lb (6.8 kg) y de  una ciruga plvica. Capuchn cervical Un capuchn cervical es una copa redonda y blanda de ltex o plstico que se coloca en el cuello uterino. Se inserta en la vagina antes del sexo, junto con un espermicida. Bloquea el ingreso del esperma en el tero. El capuchn Radio producer durante 6 a 8 horas despus de Warehouse manager sexo y debe retirarse en el plazo de las 48 horas. Un capuchn cervical debe ser recetado y colocado por un mdico. Debe reemplazarse cada 2 aos. Esponja Una esponja es una pieza blanda y circular de espuma de poliuretano que contiene espermicida. La esponja ayuda a bloquear el ingreso de esperma en el tero, y el espermicida mata a los espermatozoides. Belva Bertin, debe humedecerla e insertarla en la vagina. Debe insertarse antes de eBay, debe permanecer dentro al menos durante 6 horas despus de tener sexo y debe retirarse y Nurse, adult en el plazo de las 30 horas. Espermicidas Los espermicidas son sustancias qumicas que matan o bloquean al esperma y no lo dejan ingresar al  cuello uterino y al tero. Vienen en forma de crema, gel, supositorio, espuma o comprimido. Un espermicida debe insertarse en la vagina con un aplicador al menos 10 o 15 minutos antes de tener sexo para dar tiempo a que surta [place]. El proceso debe repetirse cada vez que tenga sexo. Los espermicidas no requieren Emergency planning/management officer. Anticonceptivos intrauterinos Dispositivo intrauterino (DIU) Un DIU es un dispositivo en forma de T que se coloca en el tero. Existen dos tipos: DIU hormonal.Este tipo contiene progestina, una forma sinttica de la hormona progesterona. Este tipo puede permanecer colocado durante 3 a 5 aos. DIU de cobre.Este tipo est recubierto con un alambre de cobre. Puede permanecer colocado durante 10 aos. Mtodos anticonceptivos permanentes Ligadura de trompas en la mujer En este mtodo, se sellan, atan u obstruyen las trompas de Falopio durante una ciruga para Automotive engineer que el vulo descienda [place]. Esterilizacin histeroscpica En este mtodo, se coloca un implante pequeo y flexible dentro de cada trompa de Falopio. Los implantes hacen que se forme un tejido cicatricial en las trompas de Falopio y que las obstruya para que el espermatozoide no pueda llegar al vulo. El procedimiento demora alrededor de 3 meses para que sea [place]. Debe utilizarse otro mtodo anticonceptivo durante esos 3 meses. Esterilizacin masculina Este es un procedimiento que consiste en atar los conductos que transportan el esperma (vasectoma). Luego del procedimiento, el hombre Manufacturing engineer lquido (semen). Debe utilizarse otro mtodo anticonceptivo durante 3 meses despus del procedimiento. Mtodos de planificacin natural Planificacin familiar natural En este mtodo, la pareja no tiene American Family Insurance la mujer podra quedar [place]. Mtodo calendario En este mtodo, la mujer realiza un seguimiento de la duracin de cada ciclo menstrual, identifica los Becton, Dickinson and Company que se puede  producir un Psychiatrist y no tiene sexo durante esos [address]  [address]. Mtodo de la ovulacin En este mtodo, la pareja evita tener sexo durante la ovulacin. Mtodo sintotrmico Este mtodo implica no tener sexo durante la ovulacin. Normalmente, la mujer comprueba la ovulacin al observar cambios en su temperatura y en la consistencia del moco cervical. Mtodo posovulacin En este mtodo, la pareja espera a que finalice la ovulacin para Doctor, hospital. Dnde buscar ms informacin Centers for Disease Control and Prevention (Centros para el Control y Psychiatrist de Event organiser): [email or web address].br Resumen La anticoncepcin, o los mtodos anticonceptivos, hace referencia a los mtodos o dispositivos que evitan el [place]. Los mtodos anticonceptivos hormonales incluyen implantes,  inyecciones, pastillas, parches, anillos vaginales y anticonceptivos de Associate Professor. Los mtodos anticonceptivos de barrera pueden incluir condones masculinos, condones femeninos, diafragmas, capuchones cervicales, esponjas y espermicidas. Guardian Life Insurance tipos de DIU (dispositivo intrauterino). Un DIU puede colocarse en el tero de una mujer para evitar el embarazo durante 3 a 5 aos. La esterilizacin permanente puede realizarse mediante un procedimiento tanto en los hombres como en las mujeres. Los The Kroger de Medical sales representative natural implican no tener American Family Insurance la mujer podra quedar [place]. Esta informacin no tiene Theme park manager el consejo del mdico. Asegrese de hacerle al mdico cualquier pregunta que tenga. Document Revised: 07/24/2019 Document Reviewed: 07/24/2019 Elsevier Patient Education  2023 ArvinMeritor.

## 2022-05-20 LAB — AFP, SERUM, OPEN SPINA BIFIDA
AFP MoM: 0.7
AFP Value: 16.3 ng/mL
Gest. Age on Collection Date: 15.1 weeks
Maternal Age At EDD: 25.4 yr
OSBR Risk 1 IN: 10000
Test Results:: NEGATIVE
Weight: 228 [lb_av]

## 2022-05-20 LAB — CBC/D/PLT+RPR+RH+ABO+RUBIGG...
Antibody Screen: NEGATIVE
Basophils Absolute: 0 10*3/uL (ref 0.0–0.2)
Basos: 0 %
EOS (ABSOLUTE): 0.6 10*3/uL — ABNORMAL HIGH (ref 0.0–0.4)
Eos: 8 %
HCV Ab: NONREACTIVE
HIV Screen 4th Generation wRfx: NONREACTIVE
Hematocrit: 34.9 % (ref 34.0–46.6)
Hemoglobin: 11.6 g/dL (ref 11.1–15.9)
Hepatitis B Surface Ag: NEGATIVE
Immature Grans (Abs): 0 10*3/uL (ref 0.0–0.1)
Immature Granulocytes: 0 %
Lymphocytes Absolute: 1.7 10*3/uL (ref 0.7–3.1)
Lymphs: 24 %
MCH: 28.3 pg (ref 26.6–33.0)
MCHC: 33.2 g/dL (ref 31.5–35.7)
MCV: 85 fL (ref 79–97)
Monocytes Absolute: 0.4 10*3/uL (ref 0.1–0.9)
Monocytes: 5 %
Neutrophils Absolute: 4.4 10*3/uL (ref 1.4–7.0)
Neutrophils: 63 %
Platelets: 226 10*3/uL (ref 150–450)
RBC: 4.1 x10E6/uL (ref 3.77–5.28)
RDW: 14.3 % (ref 11.7–15.4)
RPR Ser Ql: NONREACTIVE
Rh Factor: POSITIVE
Rubella Antibodies, IGG: 2.54 index (ref >0.99)
WBC: 7.1 10*3/uL (ref 3.4–10.8)

## 2022-05-20 LAB — URINE CULTURE, OB REFLEX

## 2022-05-20 LAB — HCV INTERPRETATION

## 2022-05-20 LAB — CULTURE, OB URINE

## 2022-05-20 LAB — HEMOGLOBIN A1C
Est. average glucose Bld gHb Est-mCnc: 114 mg/dL
Hgb A1c MFr Bld: 5.6 % (ref 4.8–5.6)

## 2022-05-27 LAB — PANORAMA PRENATAL TEST FULL PANEL:PANORAMA TEST PLUS 5 ADDITIONAL MICRODELETIONS: FETAL FRACTION: 7.3

## 2022-05-28 ENCOUNTER — Telehealth: Payer: Self-pay

## 2022-05-28 NOTE — Telephone Encounter (Signed)
Called Elbia with [employer] [facility] to follow up on VM left on 05/21/22. Elbia agrees to reach out to patient to start registration process. Will follow up with patient next week to see if she has been registered. If she is registered, will schedule anatomy US with [place].

## 2022-06-17 ENCOUNTER — Encounter: Payer: Self-pay | Admitting: Obstetrics and Gynecology

## 2022-06-17 ENCOUNTER — Ambulatory Visit: Payer: Medicaid Other | Admitting: Obstetrics and Gynecology

## 2022-06-17 VITALS — BP 110/66 | HR 86 | Wt 233.0 lb

## 2022-06-17 DIAGNOSIS — Z603 Acculturation difficulty: Secondary | ICD-10-CM

## 2022-06-17 DIAGNOSIS — O99210 Obesity complicating pregnancy, unspecified trimester: Secondary | ICD-10-CM

## 2022-06-17 DIAGNOSIS — O99212 Obesity complicating pregnancy, second trimester: Secondary | ICD-10-CM

## 2022-06-17 DIAGNOSIS — Z758 Other problems related to medical facilities and other health care: Secondary | ICD-10-CM

## 2022-06-17 DIAGNOSIS — Z3A19 19 weeks gestation of pregnancy: Secondary | ICD-10-CM

## 2022-06-17 DIAGNOSIS — Z3480 Encounter for supervision of other normal pregnancy, unspecified trimester: Secondary | ICD-10-CM

## 2022-06-17 DIAGNOSIS — Z8632 Personal history of gestational diabetes: Secondary | ICD-10-CM

## 2022-06-17 DIAGNOSIS — Z6839 Body mass index (BMI) 39.0-39.9, adult: Secondary | ICD-10-CM | POA: Insufficient documentation

## 2022-06-17 NOTE — Progress Notes (Signed)
   PRENATAL VISIT NOTE  Subjective:  Leah Garcia is a 27 y.o. G3P2002 at [redacted]w[redacted]d being seen today for ongoing prenatal care.  She is currently monitored for the following issues for this low-risk pregnancy and has Maternal morbid obesity, antepartum (HCC); Prediabetes; Language barrier affecting health care; History of gestational diabetes; Supervision of other normal pregnancy, antepartum; Unwanted fertility; and BMI 39.0-39.9,adult on their problem list.  Patient reports no complaints.  Contractions: Not present. Vag. Bleeding: None.  Movement: Present. Denies leaking of fluid.   The following portions of the patient's history were reviewed and updated as appropriate: allergies, current medications, past family history, past medical history, past social history, past surgical history and problem list.   Objective:   Vitals:   06/17/22 1149  BP: 110/66  Pulse: 86  Weight: 233 lb (105.7 kg)    Fetal Status: Fetal Heart Rate (bpm): 142   Movement: Present     General:  Alert, oriented and cooperative. Patient is in no acute distress.  Skin: Skin is warm and dry. No rash noted.   Cardiovascular: Normal heart rate noted  Respiratory: Normal respiratory effort, no problems with respiration noted  Abdomen: Soft, gravid, appropriate for gestational age.  Pain/Pressure: Present     Pelvic: Cervical exam deferred        Extremities: Normal range of motion.  Edema: None  Mental Status: Normal mood and affect. Normal behavior. Normal judgment and thought content.   Assessment and Plan:  Pregnancy: G3P2002 at [redacted]w[redacted]d 1. Supervision of other normal pregnancy, antepartum Routine care. Pt scheduled for mfm anatomy u/s  2. Language barrier In person interpreter used  3. History of gestational diabetes Early A1c neg  4. BMI 39.0-39.9,adult 13lbs twg. Total for pregnancy d/w her (approx 25 lbs)  5. Maternal morbid obesity, antepartum (HCC)  Preterm labor symptoms and  general obstetric precautions including but not limited to vaginal bleeding, contractions, leaking of fluid and fetal movement were reviewed in detail with the patient. Please refer to After Visit Summary for other counseling recommendations.   Return in 5 weeks (on 07/21/2022) for low risk ob, md or app, in person.  Future Appointments  Date Time Provider Department Center  07/15/2022  2:35 PM Venora Maples, MD [facility]  07/21/2022  9:30 AM [facility] NURSE [facility]  07/21/2022  9:45 AM [facility] US5 [facility]    Regan Bing, MD

## 2022-07-07 IMAGING — US US MFM OB FOLLOW-UP
1 series · 14 of 28 positions shown · non-contrast
Comparison: none

[Series 1: us mfm ob follow-up · 52 acquisitions, 14 frames shown]
[im 2/52]
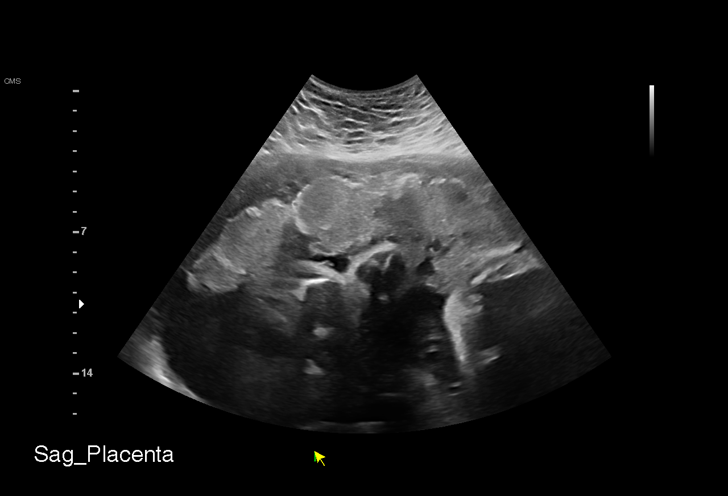
[im 6/52]
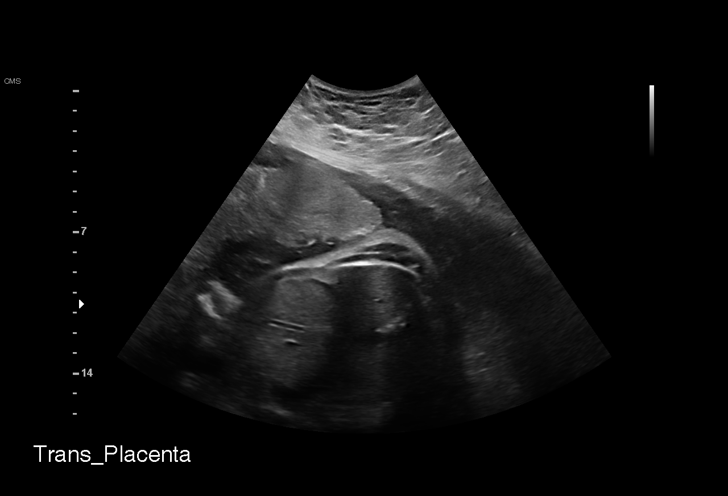
[im 10/52]
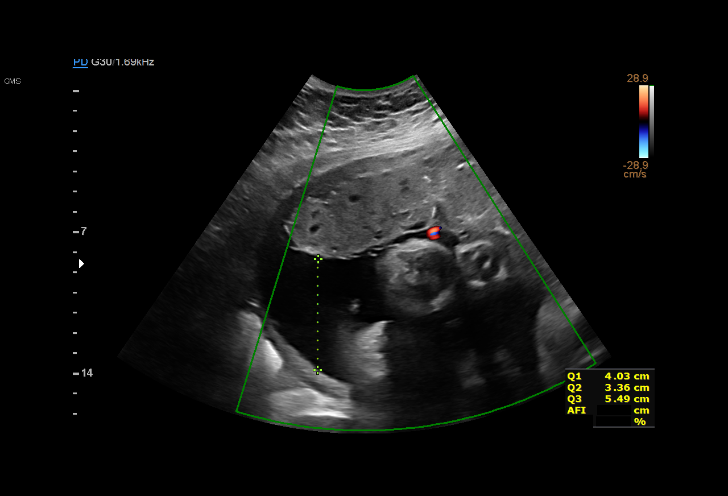
[im 14/52]
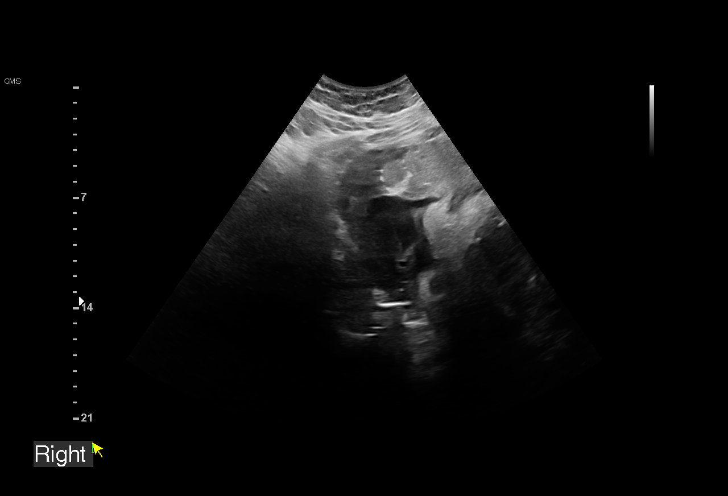
[im 18/52]
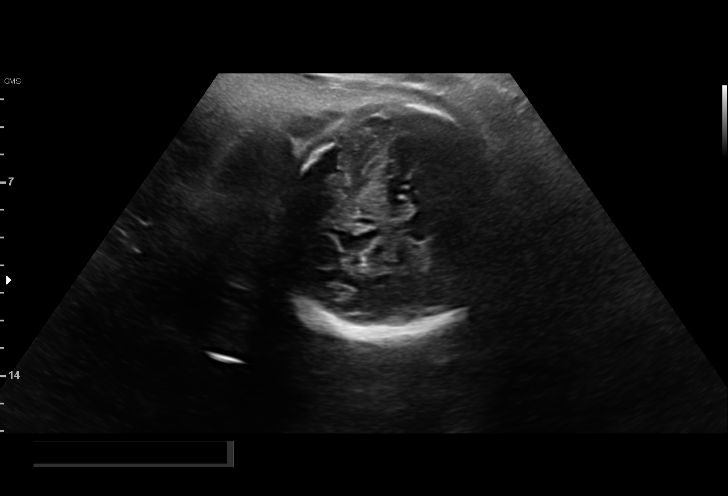
[im 21/52]
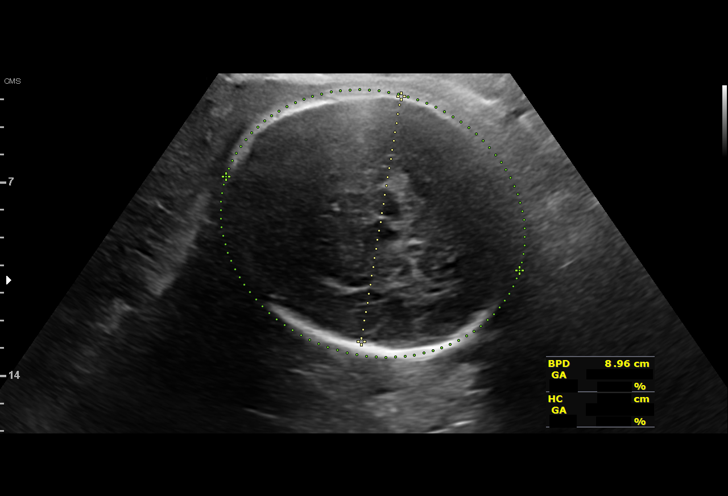
[im 25/52]
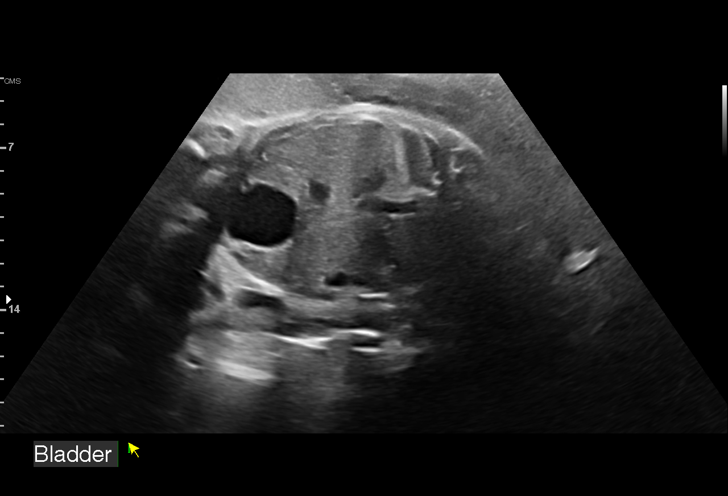
[im 29/52]
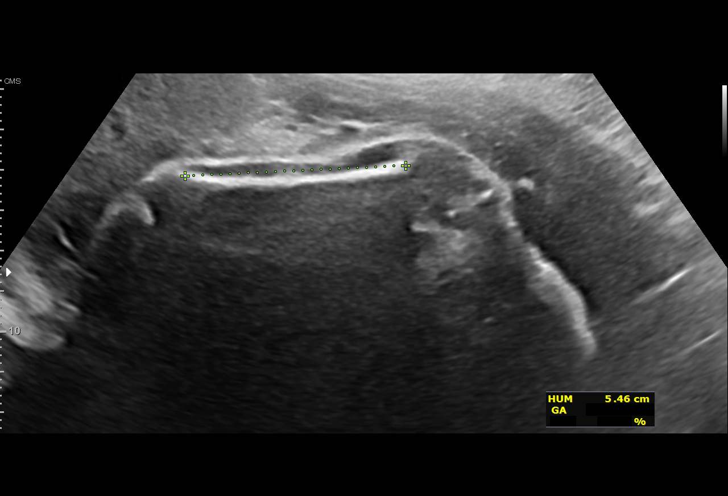
[im 33/52]
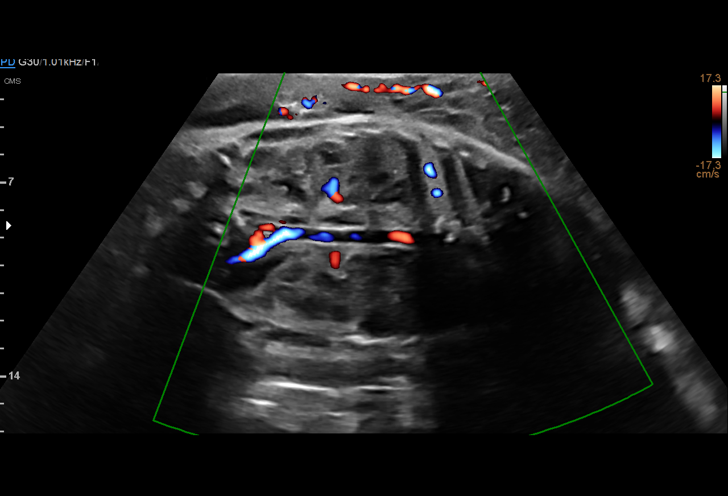
[im 36/52]
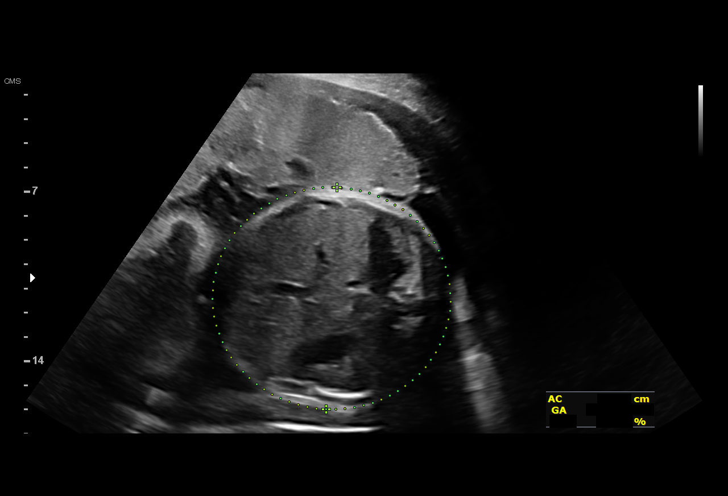
[im 40/52]
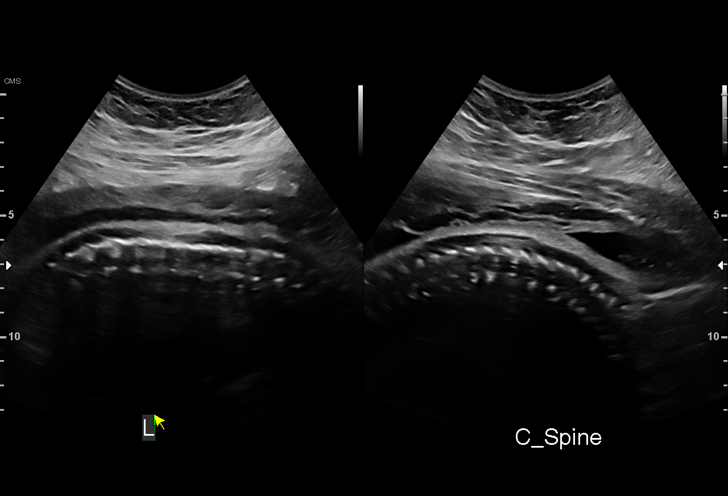
[im 44/52]
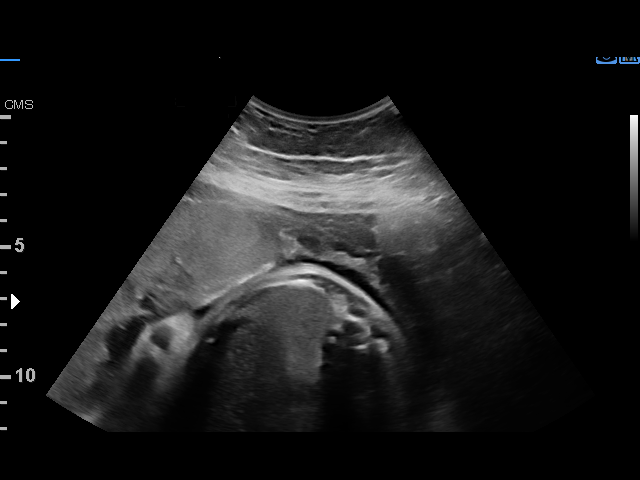
[im 48/52]
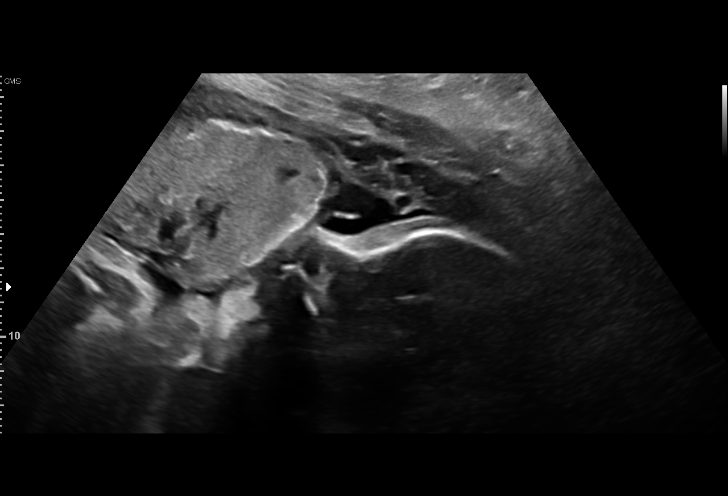
[im 52/52]
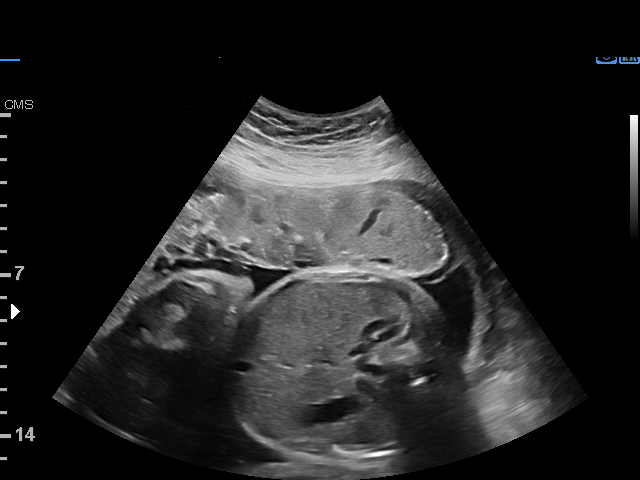

[14 of 28 positions shown; findings below may reference images not displayed]

[REDACTED]

Indications

 34 weeks gestation of pregnancy
 Gestational diabetes in pregnancy, diet
 controlled
 Negative AFP
 Obesity complicating pregnancy, third
 trimester (BMI 33)
 Antenatal follow-up for nonvisualized fetal
 anatomy
Fetal Evaluation

 Num Of Fetuses:         1
 Fetal Heart Rate(bpm):  150
 Cardiac Activity:       Observed
 Presentation:           Cephalic
 Placenta:               Anterior
 P. Cord Insertion:      Previously Visualized

 Amniotic Fluid
 AFI FV:      Within normal limits

 AFI Sum(cm)     %Tile       Largest Pocket(cm)
 15.9            57

 RUQ(cm)       RLQ(cm)       LUQ(cm)        LLQ(cm)
 4             3
Biometry

 BPD:      90.5  mm     G. Age:  36w 5d         98  %    CI:        77.98   %    70 - 86
                                                         FL/HC:      19.1   %    19.4 -
 HC:      324.3  mm     G. Age:  36w 5d         81  %    HC/AC:      1.07        0.96 -
 AC:      301.7  mm     G. Age:  34w 1d         58  %    FL/BPD:     68.4   %    71 - 87
 FL:       61.9  mm     G. Age:  32w 1d          5  %    FL/AC:      20.5   %    20 - 24
 HUM:      55.3  mm     G. Age:  32w 1d         20  %

 LV:        3.2  mm

 Est. FW:    7559  gm      5 lb 3 oz     45  %
OB History

 Gravidity:    2         Term:   1        Prem:   0        SAB:   0
 TOP:          0       Ectopic:  0        Living: 1
Gestational Age

 U/S Today:     35w 0d                                        EDD:   04/30/21
 Best:          34w 0d     Det. By:  Early Ultrasound         EDD:   05/07/21
Anatomy

 Cranium:               Appears normal         LVOT:                   Appears normal
 Cavum:                 Appears normal         Aortic Arch:            Not well visualized
 Ventricles:            Appears normal         Ductal Arch:            Not well visualized
 Choroid Plexus:        Appears normal         Diaphragm:              Appears normal
 Cerebellum:            Appears normal         Stomach:                Appears normal, left
                                                                       sided
 Posterior Fossa:       Appears normal         Abdomen:                Appears normal
 Nuchal Fold:           Not well visualized    Abdominal Wall:         Not well visualized
 Face:                  Appears normal         Cord Vessels:           Previously seen
                        (orbits and profile)
 Lips:                  Appears normal         Kidneys:                Appear normal
 Palate:                Not well visualized    Bladder:                Appears normal
 Thoracic:              Appears normal         Spine:                  Appears normal
 Heart:                 Previously seen        Upper Extremities:      Previously seen
 RVOT:                  Previously seen        Lower Extremities:      Previously seen

 Other:  3VV, nasal bone, and lenses visualized.  Heels previously visualized.
         Open Right hand previously visualized. Technicallly difficult due to
         advanced GA and maternal habitus.
Cervix Uterus Adnexa

 Cervix
 Not visualized (advanced GA >92wks)

 Uterus
 No abnormality visualized.

 Right Ovary
 Not visualized.
 Left Ovary
 Within normal limits.

 Cul De Sac
 No free fluid seen.

 Adnexa
 No abnormality visualized.
Impression

 Amniotic fluid is normal and good fetal activity is seen .Fetal
 growth is appropriate for gestational age . Fetal cerebellum
 and spine appear normal.

 Patient has gestational diabetes that is well controlled on
 diet.  She, however, was not checking her blood glucose
 because of lack of strips.  I encouraged her to check her
 blood glucose regularly.
Recommendations

 -An appointment was made for her to return in 4 weeks for
 fetal growth assessment.
                 SEGOVIANO

## 2022-07-15 ENCOUNTER — Encounter: Payer: Self-pay | Admitting: Family Medicine

## 2022-07-15 ENCOUNTER — Other Ambulatory Visit (HOSPITAL_COMMUNITY): Admission: RE | Admit: 2022-07-15 | Payer: Medicaid Other | Source: Ambulatory Visit

## 2022-07-15 ENCOUNTER — Encounter: Payer: Self-pay | Admitting: General Practice

## 2022-07-15 ENCOUNTER — Other Ambulatory Visit: Payer: Self-pay

## 2022-07-15 ENCOUNTER — Ambulatory Visit (INDEPENDENT_AMBULATORY_CARE_PROVIDER_SITE_OTHER): Payer: Medicaid Other | Admitting: Family Medicine

## 2022-07-15 VITALS — BP 107/74 | HR 85

## 2022-07-15 DIAGNOSIS — N898 Other specified noninflammatory disorders of vagina: Secondary | ICD-10-CM | POA: Diagnosis present

## 2022-07-15 DIAGNOSIS — Z3480 Encounter for supervision of other normal pregnancy, unspecified trimester: Secondary | ICD-10-CM

## 2022-07-15 DIAGNOSIS — Z603 Acculturation difficulty: Secondary | ICD-10-CM

## 2022-07-15 DIAGNOSIS — Z3A23 23 weeks gestation of pregnancy: Secondary | ICD-10-CM

## 2022-07-15 DIAGNOSIS — Z8632 Personal history of gestational diabetes: Secondary | ICD-10-CM

## 2022-07-15 DIAGNOSIS — Z3009 Encounter for other general counseling and advice on contraception: Secondary | ICD-10-CM

## 2022-07-15 DIAGNOSIS — Z758 Other problems related to medical facilities and other health care: Secondary | ICD-10-CM

## 2022-07-15 DIAGNOSIS — O99212 Obesity complicating pregnancy, second trimester: Secondary | ICD-10-CM

## 2022-07-15 NOTE — Progress Notes (Signed)
   Subjective:  Leah Garcia is a 27 y.o. G3P2002 at [redacted]w[redacted]d being seen today for ongoing prenatal care.  She is currently monitored for the following issues for this high-risk pregnancy and has Maternal morbid obesity, antepartum (HCC); Language barrier affecting health care; History of gestational diabetes; Supervision of other normal pregnancy, antepartum; Unwanted fertility; and BMI 39.0-39.9,adult on their problem list.  Patient reports no complaints.  Contractions: Not present. Vag. Bleeding: None.  Movement: Present. Denies leaking of fluid.   The following portions of the patient's history were reviewed and updated as appropriate: allergies, current medications, past family history, past medical history, past social history, past surgical history and problem list. Problem list updated.  Objective:   Vitals:   07/15/22 1431  BP: 107/74  Pulse: 85    Fetal Status: Fetal Heart Rate (bpm): 142   Movement: Present     General:  Alert, oriented and cooperative. Patient is in no acute distress.  Skin: Skin is warm and dry. No rash noted.   Cardiovascular: Normal heart rate noted  Respiratory: Normal respiratory effort, no problems with respiration noted  Abdomen: Soft, gravid, appropriate for gestational age. Pain/Pressure: Present     Pelvic: Vag. Bleeding: None     Cervical exam deferred        Extremities: Normal range of motion.  Edema: None  Mental Status: Normal mood and affect. Normal behavior. Normal judgment and thought content.   Urinalysis:      Assessment and Plan:  Pregnancy: G3P2002 at [redacted]w[redacted]d  1. Vaginal itching Self swab collected - Cervicovaginal ancillary only( [facility])  2. Supervision of other normal pregnancy, antepartum BP and FHR normal  3. Unwanted fertility Signed BTL papers today as she apparently has Medicaid [facility] Will clarify what her financial situation is  4. Language barrier affecting health care Spanish  5. History of  gestational diabetes A1c normal at new OB visit  6. Maternal morbid obesity, antepartum (HCC)   Preterm labor symptoms and general obstetric precautions including but not limited to vaginal bleeding, contractions, leaking of fluid and fetal movement were reviewed in detail with the patient. Please refer to After Visit Summary for other counseling recommendations.  Return in 4 weeks (on 08/12/2022) for [facility], ob visit.   Venora Maples, MD

## 2022-07-15 NOTE — Patient Instructions (Signed)
Eleccin del mtodo anticonceptivo Contraception Choices La anticoncepcin, o los mtodos anticonceptivos, hace referencia a los mtodos o dispositivos que evitan el embarazo. Mtodos hormonales  Implante anticonceptivo Un implante anticonceptivo consiste en un tubo delgado de plstico que contiene una hormona que evita el embarazo. Es diferente de un dispositivo intrauterino (DIU). Un mdico lo inserta en la parte superior del brazo. Los implantes pueden ser eficaces durante un mximo de 3 aos. Inyecciones de progestina sola Las inyecciones de progestina sola contienen progestina, una forma sinttica de la hormona progesterona. Un mdico las administra cada 3 meses. Pldoras anticonceptivas Las pldoras anticonceptivas son pastillas que contienen hormonas que evitan el embarazo. Deben tomarse una vez al da, preferentemente a la misma hora cada da. Se necesita una receta para utilizar este mtodo anticonceptivo. Parche anticonceptivo El parche anticonceptivo contiene hormonas que evitan el embarazo. Se coloca en la piel, debe cambiarse una vez a la semana durante tres semanas y debe retirarse en la cuarta semana. Se necesita una receta para utilizar este mtodo anticonceptivo. Anillo vaginal Un anillo vaginal contiene hormonas que evitan el embarazo. Se coloca en la vagina durante tres semanas y se retira en la cuarta semana. Luego se repite el proceso con un anillo nuevo. Se necesita una receta para utilizar este mtodo anticonceptivo. Anticonceptivo de emergencia Los anticonceptivos de emergencia son mtodos para evitar un embarazo despus de tener sexo sin proteccin. Vienen en forma de pldora y pueden tomarse hasta 5 das despus de tener sexo. Funcionan mejor cuando se toman lo ms pronto posible luego de tener sexo. La mayora de los anticonceptivos de emergencia estn disponibles sin receta mdica. Este mtodo no debe utilizarse como el nico mtodo anticonceptivo. Mtodos de  barrera  Condn masculino Un condn masculino es una vaina delgada que se coloca sobre el pene durante el sexo. Los condones evitan que el esperma ingrese en el cuerpo de la mujer. Pueden utilizarse con un una sustancia que mata a los espermatozoides (espermicida) para aumentar la efectividad. Deben desecharse despus de un uso. Condn femenino Un condn femenino es una vaina blanda y holgada que se coloca en la vagina antes de tener sexo. El condn evita que el esperma ingrese en el cuerpo de la mujer. Deben desecharse despus de un uso. Diafragma Un diafragma es una barrera blanda con forma de cpula. Se inserta en la vagina antes del sexo, junto con un espermicida. El diafragma bloquea el ingreso de esperma en el tero, y el espermicida mata a los espermatozoides. El diafragma debe permanecer en la vagina durante 6 a 8 horas despus de tener sexo y debe retirarse en el plazo de las 24 horas. Un diafragma es recetado y colocado por un mdico. Debe reemplazarse cada 1 a 2 aos, despus de dar a luz, de aumentar ms de 15lb (6.8kg) y de una ciruga plvica. Capuchn cervical Un capuchn cervical es una copa redonda y blanda de ltex o plstico que se coloca en el cuello uterino. Se inserta en la vagina antes del sexo, junto con un espermicida. Bloquea el ingreso del esperma en el tero. El capuchn debe permanecer en el lugar durante 6 a 8 horas despus de tener sexo y debe retirarse en el plazo de las 48 horas. Un capuchn cervical debe ser recetado y colocado por un mdico. Debe reemplazarse cada 2aos. Esponja Una esponja es una pieza blanda y circular de espuma de poliuretano que contiene espermicida. La esponja ayuda a bloquear el ingreso de esperma en el tero, y el espermicida   mata a los espermatozoides. Para utilizarla, debe humedecerla e insertarla en la vagina. Debe insertarse antes de tener sexo, debe permanecer dentro al menos durante 6 horas despus de tener sexo y debe retirarse y  desecharse en el plazo de las 30 horas. Espermicidas Los espermicidas son sustancias qumicas que matan o bloquean al esperma y no lo dejan ingresar al cuello uterino y al tero. Vienen en forma de crema, gel, supositorio, espuma o comprimido. Un espermicida debe insertarse en la vagina con un aplicador al menos 10 o 15 minutos antes de tener sexo para dar tiempo a que surta efecto. El proceso debe repetirse cada vez que tenga sexo. Los espermicidas no requieren receta mdica. Anticonceptivos intrauterinos Dispositivo intrauterino (DIU) Un DIU es un dispositivo en forma de T que se coloca en el tero. Existen dos tipos: DIU hormonal.Este tipo contiene progestina, una forma sinttica de la hormona progesterona. Este tipo puede permanecer colocado durante 3 a 5 aos. DIU de cobre.Este tipo est recubierto con un alambre de cobre. Puede permanecer colocado durante 10 aos. Mtodos anticonceptivos permanentes Ligadura de trompas en la mujer En este mtodo, se sellan, atan u obstruyen las trompas de Falopio durante una ciruga para evitar que el vulo descienda hacia el tero. Esterilizacin histeroscpica En este mtodo, se coloca un implante pequeo y flexible dentro de cada trompa de Falopio. Los implantes hacen que se forme un tejido cicatricial en las trompas de Falopio y que las obstruya para que el espermatozoide no pueda llegar al vulo. El procedimiento demora alrededor de 3 meses para que sea efectivo. Debe utilizarse otro mtodo anticonceptivo durante esos 3 meses. Esterilizacin masculina Este es un procedimiento que consiste en atar los conductos que transportan el esperma (vasectoma). Luego del procedimiento, el hombre puede eyacular lquido (semen). Debe utilizarse otro mtodo anticonceptivo durante 3 meses despus del procedimiento. Mtodos de planificacin natural Planificacin familiar natural En este mtodo, la pareja no tiene sexo durante los das en que la mujer podra quedar  embarazada. Mtodo calendario En este mtodo, la mujer realiza un seguimiento de la duracin de cada ciclo menstrual, identifica los das en los que se puede producir un embarazo y no tiene sexo durante esos das. Mtodo de la ovulacin En este mtodo, la pareja evita tener sexo durante la ovulacin. Mtodo sintotrmico Este mtodo implica no tener sexo durante la ovulacin. Normalmente, la mujer comprueba la ovulacin al observar cambios en su temperatura y en la consistencia del moco cervical. Mtodo posovulacin En este mtodo, la pareja espera a que finalice la ovulacin para tener sexo. Dnde buscar ms informacin Centers for Disease Control and Prevention (Centros para el Control y la Prevencin de Enfermedades): www.cdc.gov Resumen La anticoncepcin, o los mtodos anticonceptivos, hace referencia a los mtodos o dispositivos que evitan el embarazo. Los mtodos anticonceptivos hormonales incluyen implantes, inyecciones, pastillas, parches, anillos vaginales y anticonceptivos de emergencia. Los mtodos anticonceptivos de barrera pueden incluir condones masculinos, condones femeninos, diafragmas, capuchones cervicales, esponjas y espermicidas. Existen dos tipos de DIU (dispositivo intrauterino). Un DIU puede colocarse en el tero de una mujer para evitar el embarazo durante 3 a 5 aos. La esterilizacin permanente puede realizarse mediante un procedimiento tanto en los hombres como en las mujeres. Los mtodos de planificacin familiar natural implican no tener sexo durante los das en que la mujer podra quedar embarazada. Esta informacin no tiene como fin reemplazar el consejo del mdico. Asegrese de hacerle al mdico cualquier pregunta que tenga. Document Revised: 07/24/2019 Document Reviewed: 07/24/2019 Elsevier Patient Education    2024 Elsevier Inc.  

## 2022-07-16 LAB — CERVICOVAGINAL ANCILLARY ONLY
Bacterial Vaginitis (gardnerella): NEGATIVE
Candida Glabrata: NEGATIVE
Candida Vaginitis: POSITIVE — AB
Chlamydia: NEGATIVE
Comment: NEGATIVE
Comment: NEGATIVE
Comment: NEGATIVE
Comment: NEGATIVE
Comment: NEGATIVE
Comment: NORMAL
Neisseria Gonorrhea: NEGATIVE
Trichomonas: NEGATIVE

## 2022-07-19 ENCOUNTER — Encounter: Payer: Self-pay | Admitting: *Deleted

## 2022-07-19 MED ORDER — FLUCONAZOLE 150 MG PO TABS: 150.0000 mg | ORAL_TABLET | Freq: Once | ORAL | 0 refills | Status: AC

## 2022-07-19 NOTE — Addendum Note (Signed)
Addended by: Merian Capron on: 07/19/2022 09:09 AM   Modules accepted: Orders

## 2022-07-21 ENCOUNTER — Other Ambulatory Visit: Payer: Self-pay | Admitting: *Deleted

## 2022-07-21 ENCOUNTER — Ambulatory Visit: Payer: Medicaid Other | Attending: Family Medicine

## 2022-07-21 ENCOUNTER — Encounter: Payer: Self-pay | Admitting: *Deleted

## 2022-07-21 ENCOUNTER — Ambulatory Visit: Payer: Medicaid Other | Admitting: *Deleted

## 2022-07-21 VITALS — BP 103/62 | HR 90

## 2022-07-21 DIAGNOSIS — Z363 Encounter for antenatal screening for malformations: Secondary | ICD-10-CM | POA: Insufficient documentation

## 2022-07-21 DIAGNOSIS — O99212 Obesity complicating pregnancy, second trimester: Secondary | ICD-10-CM | POA: Insufficient documentation

## 2022-07-21 DIAGNOSIS — O09299 Supervision of pregnancy with other poor reproductive or obstetric history, unspecified trimester: Secondary | ICD-10-CM | POA: Diagnosis not present

## 2022-07-21 DIAGNOSIS — Z3480 Encounter for supervision of other normal pregnancy, unspecified trimester: Secondary | ICD-10-CM | POA: Insufficient documentation

## 2022-07-21 DIAGNOSIS — Z362 Encounter for other antenatal screening follow-up: Secondary | ICD-10-CM

## 2022-07-21 DIAGNOSIS — Z3689 Encounter for other specified antenatal screening: Secondary | ICD-10-CM | POA: Insufficient documentation

## 2022-07-21 DIAGNOSIS — O24112 Pre-existing diabetes mellitus, type 2, in pregnancy, second trimester: Secondary | ICD-10-CM | POA: Diagnosis not present

## 2022-07-21 DIAGNOSIS — Z3A24 24 weeks gestation of pregnancy: Secondary | ICD-10-CM | POA: Diagnosis not present

## 2022-07-21 DIAGNOSIS — O321XX0 Maternal care for breech presentation, not applicable or unspecified: Secondary | ICD-10-CM | POA: Insufficient documentation

## 2022-07-21 DIAGNOSIS — O09292 Supervision of pregnancy with other poor reproductive or obstetric history, second trimester: Secondary | ICD-10-CM | POA: Insufficient documentation

## 2022-08-10 IMAGING — US US MFM OB FOLLOW-UP
1 series · 14 of 28 positions shown · non-contrast
Comparison: none

[Series 1: us mfm ob follow-up · 35 acquisitions, 14 frames shown]
[im 2/35]
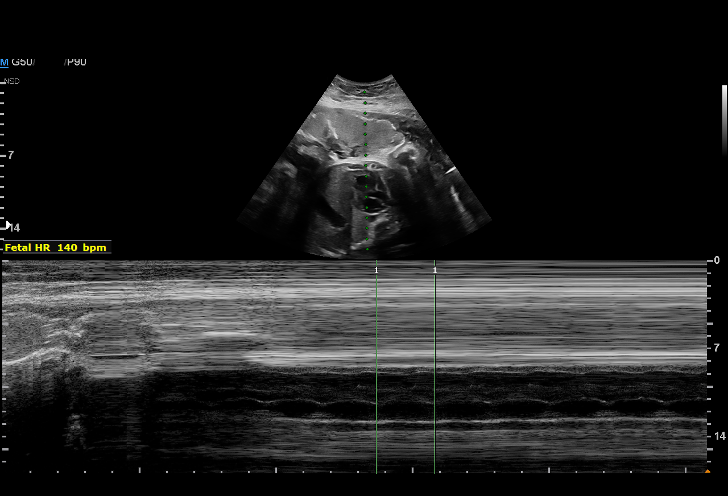
[im 4/35]
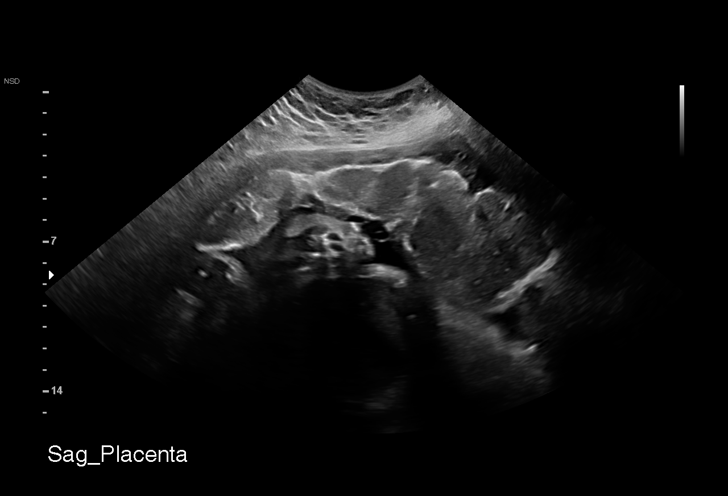
[im 7/35]
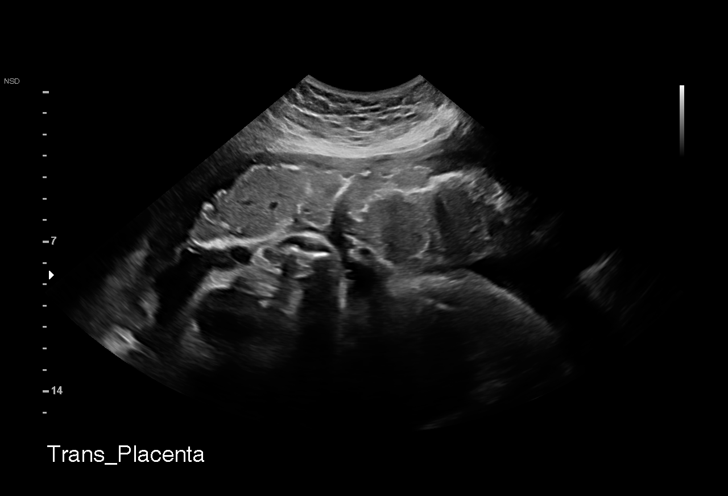
[im 9/35]
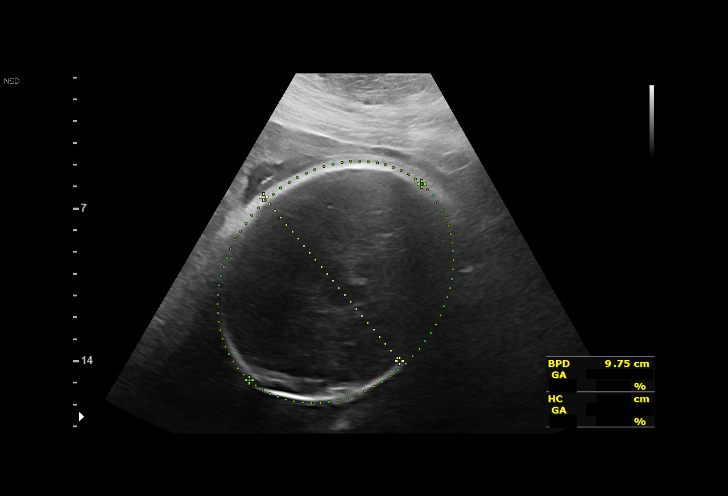
[im 12/35]
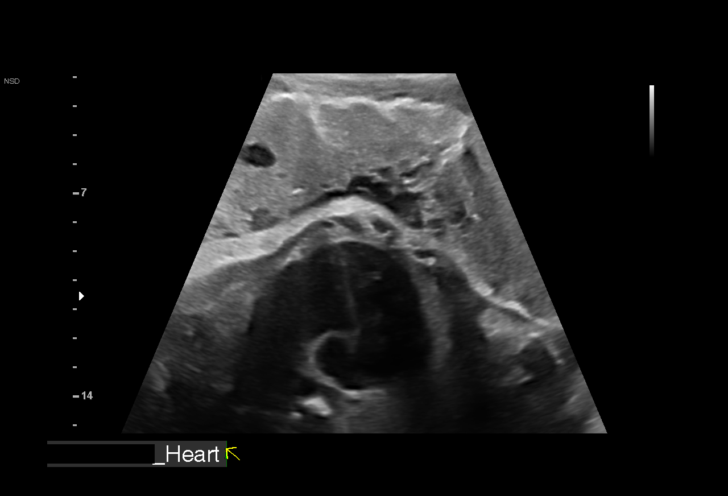
[im 14/35]
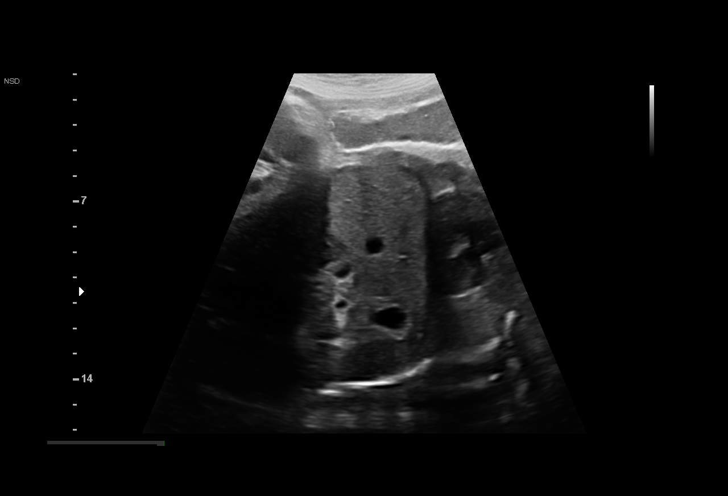
[im 17/35]
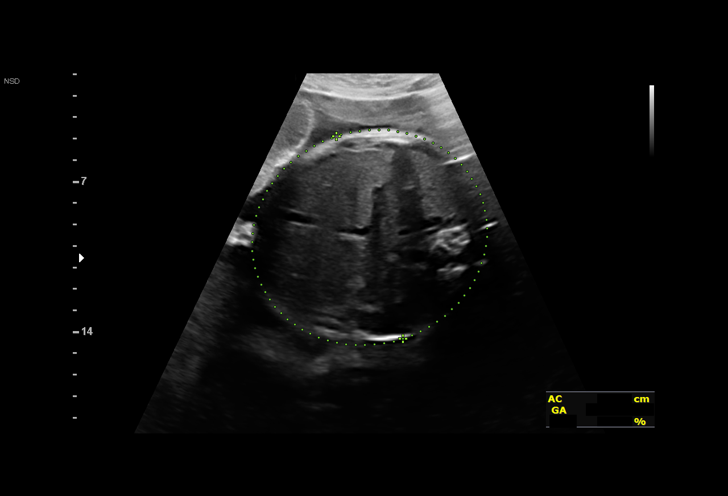
[im 19/35]
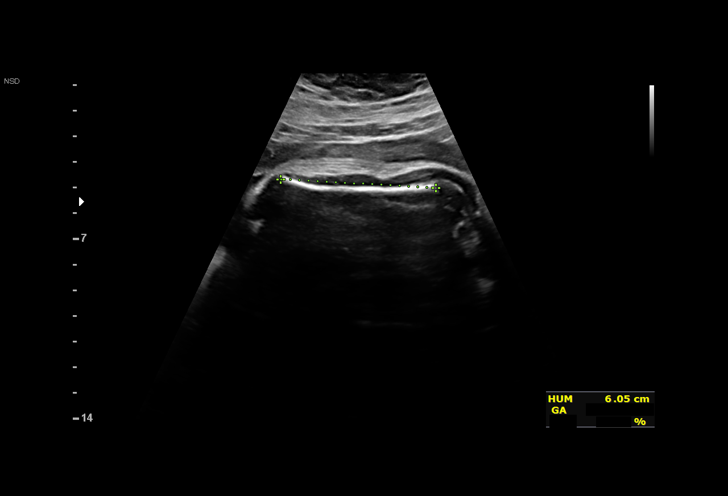
[im 22/35]
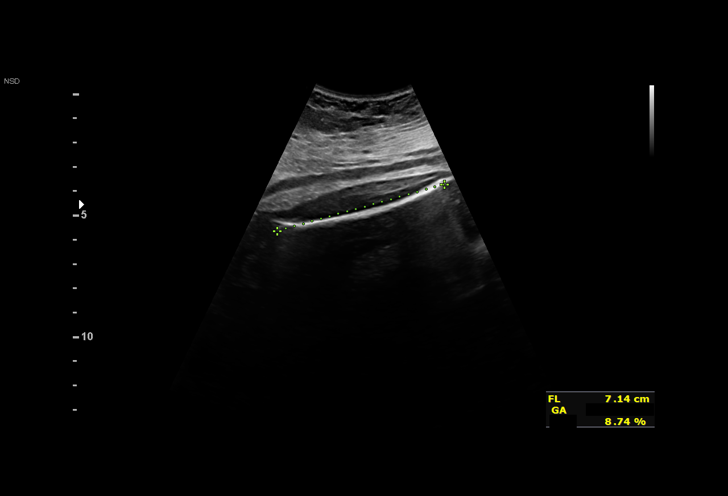
[im 24/35]
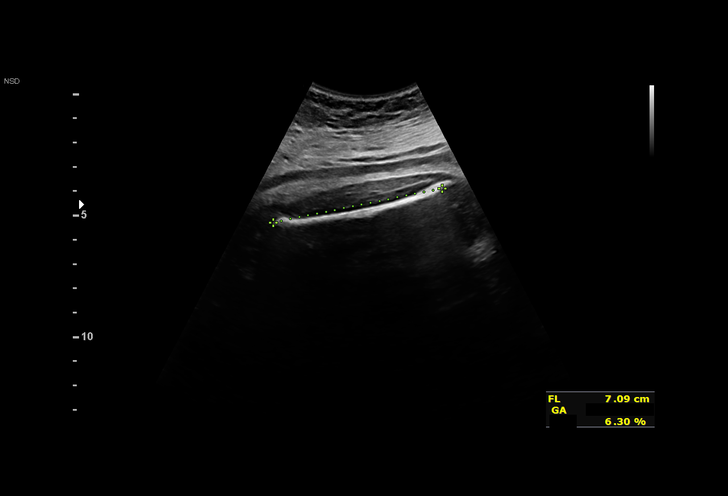
[im 27/35]
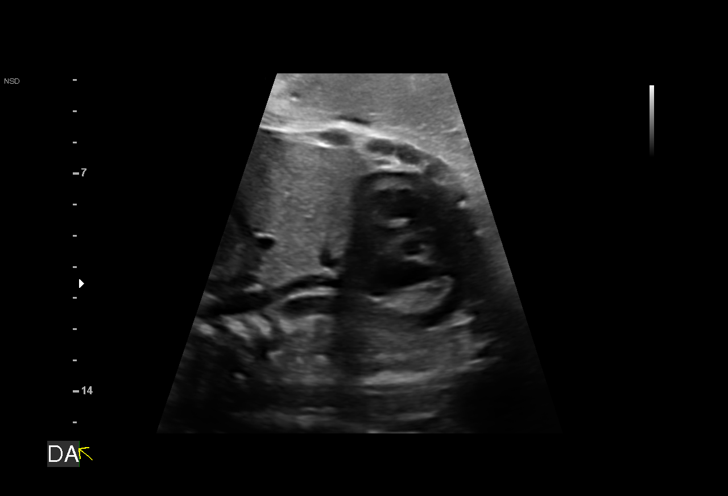
[im 29/35]
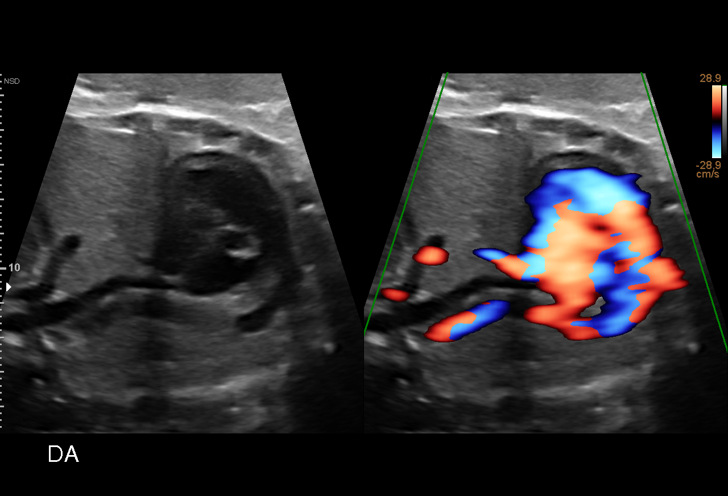
[im 32/35]
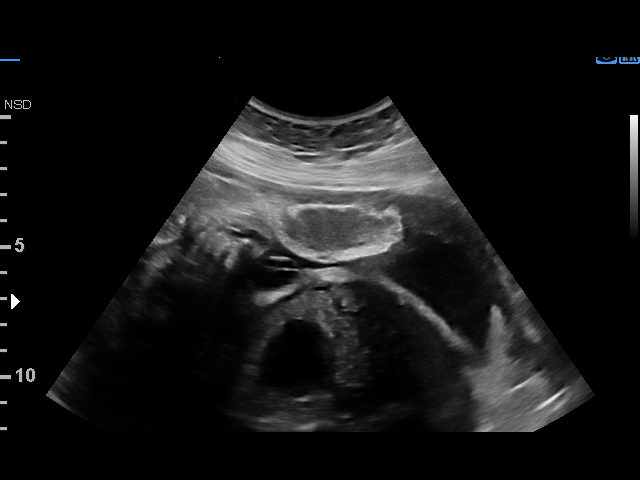
[im 35/35]
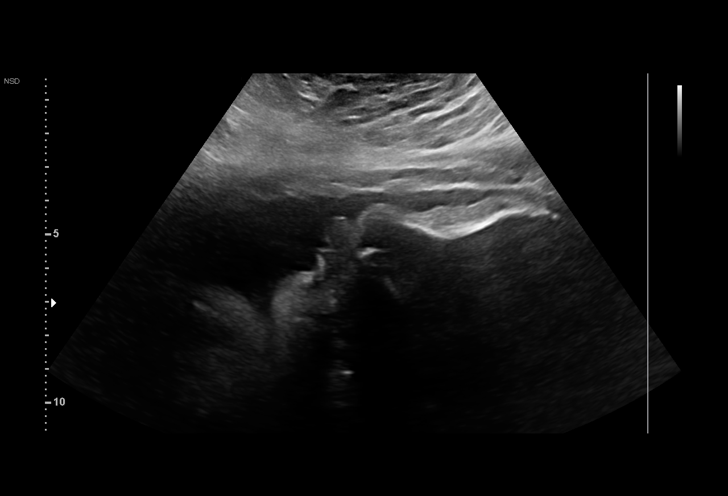

[14 of 28 positions shown; findings below may reference images not displayed]

[REDACTED]

Indications

 38 weeks gestation of pregnancy
 Antenatal follow-up for nonvisualized fetal
 anatomy
 Gestational diabetes in pregnancy, diet
 controlled
 Negative AFP
 Obesity complicating pregnancy, third
 trimester (BMI 33)
Fetal Evaluation

 Num Of Fetuses:         1
 Fetal Heart Rate(bpm):  140
 Cardiac Activity:       Observed
 Presentation:           Cephalic
 Placenta:               Anterior
 P. Cord Insertion:      Previously Visualized

 Amniotic Fluid
 AFI FV:      Within normal limits

 AFI Sum(cm)     %Tile       Largest Pocket(cm)
 15.41           62

 RUQ(cm)       RLQ(cm)       LUQ(cm)        LLQ(cm)

Biometry

 BPD:      97.3  mm     G. Age:  39w 6d         94  %    CI:        79.42   %    70 - 86
                                                         FL/HC:      20.6   %    20.6 -
 HC:      345.1  mm     G. Age:  39w 6d         63  %    HC/AC:      1.05        0.87 -
 AC:      328.3  mm     G. Age:  36w 5d         16  %    FL/BPD:     73.0   %    71 - 87
 FL:         71  mm     G. Age:  36w 3d          7  %    FL/AC:      21.6   %    20 - 24
 HUM:      60.8  mm     G. Age:  35w 2d          6  %

 Est. FW:    1705  gm           7 lb     30  %
OB History

 Gravidity:    2         Term:   1        Prem:   0        SAB:   0
 TOP:          0       Ectopic:  0        Living: 1
Gestational Age

 U/S Today:     38w 2d                                        EDD:   05/11/21
 Best:          38w 6d     Det. By:  Early Ultrasound         EDD:   05/07/21
Anatomy

 Cranium:               Appears normal         LVOT:                   Previously seen
 Cavum:                 Previously seen        Aortic Arch:            Not well visualized
 Ventricles:            Previously seen        Ductal Arch:            Appears normal
 Choroid Plexus:        Previously seen        Diaphragm:              Appears normal
 Cerebellum:            Previously seen        Stomach:                Appears normal, left
                                                                       sided
 Posterior Fossa:       Previously seen        Abdomen:                Appears normal
 Nuchal Fold:           Not well visualized    Abdominal Wall:         Appears nml (cord
                                                                       insert, abd wall)
 Face:                  Orbits and profile     Cord Vessels:           Previously seen
                        previously seen
 Lips:                  Previously seen        Kidneys:                Appear normal
 Palate:                Not well visualized    Bladder:                Appears normal
 Thoracic:              Appears normal         Spine:                  Previously seen
 Heart:                 Previously seen        Upper Extremities:      Previously seen
 RVOT:                  Previously seen        Lower Extremities:      Previously seen

 Other:  3VV, nasal bone, and lenses previously visualized.  Heels previously
         visualized.  Open Right hand previously visualized. Technicallly
         difficult due to advanced GA, fetal position, and maternal habitus.
Cervix Uterus Adnexa

 Cervix
 Not visualized (advanced GA >60wks)
Impression
 Gestational diabetes. Well-controlled on diet .
 Amniotic fluid is normal and good fetal activity is seen .Fetal
 growth is appropriate for gestational age . Cephalic
 presentation.

 Patient will be undergoing induction of labor on 05/02/21.
                RIENKE

## 2022-08-11 ENCOUNTER — Other Ambulatory Visit: Payer: Self-pay

## 2022-08-11 DIAGNOSIS — Z3480 Encounter for supervision of other normal pregnancy, unspecified trimester: Secondary | ICD-10-CM

## 2022-08-12 ENCOUNTER — Other Ambulatory Visit: Payer: Medicaid Other

## 2022-08-12 ENCOUNTER — Other Ambulatory Visit: Payer: Self-pay

## 2022-08-12 ENCOUNTER — Ambulatory Visit (INDEPENDENT_AMBULATORY_CARE_PROVIDER_SITE_OTHER): Payer: Medicaid Other | Admitting: Family Medicine

## 2022-08-12 VITALS — BP 102/71 | HR 94 | Wt 242.3 lb

## 2022-08-12 DIAGNOSIS — Z3482 Encounter for supervision of other normal pregnancy, second trimester: Secondary | ICD-10-CM

## 2022-08-12 DIAGNOSIS — Z3480 Encounter for supervision of other normal pregnancy, unspecified trimester: Secondary | ICD-10-CM

## 2022-08-12 DIAGNOSIS — Z23 Encounter for immunization: Secondary | ICD-10-CM | POA: Diagnosis not present

## 2022-08-12 DIAGNOSIS — Z603 Acculturation difficulty: Secondary | ICD-10-CM

## 2022-08-12 DIAGNOSIS — Z3A27 27 weeks gestation of pregnancy: Secondary | ICD-10-CM

## 2022-08-12 DIAGNOSIS — Z8632 Personal history of gestational diabetes: Secondary | ICD-10-CM

## 2022-08-12 DIAGNOSIS — Z758 Other problems related to medical facilities and other health care: Secondary | ICD-10-CM

## 2022-08-12 NOTE — Progress Notes (Signed)
   PRENATAL VISIT NOTE  Subjective:  Leah Garcia is a 27 y.o. G3P2002 at [redacted]w[redacted]d being seen today for ongoing prenatal care.  She is currently monitored for the following issues for this low-risk pregnancy and has Maternal morbid obesity, antepartum (HCC); Language barrier affecting health care; History of gestational diabetes; Supervision of other normal pregnancy, antepartum; Unwanted fertility; and BMI 39.0-39.9,adult on their problem list.  Patient reports no complaints.  Contractions: Not present. Vag. Bleeding: None.  Movement: Present. Denies leaking of fluid.   The following portions of the patient's history were reviewed and updated as appropriate: allergies, current medications, past family history, past medical history, past social history, past surgical history and problem list.   Objective:   Vitals:   08/12/22 0834  BP: 102/71  Pulse: 94  Weight: 242 lb 4.8 oz (109.9 kg)    Fetal Status: Fetal Heart Rate (bpm): 139 Fundal Height: 29 cm Movement: Present     General:  Alert, oriented and cooperative. Patient is in no acute distress.  Skin: Skin is warm and dry. No rash noted.   Cardiovascular: Normal heart rate noted  Respiratory: Normal respiratory effort, no problems with respiration noted  Abdomen: Soft, gravid, appropriate for gestational age.  Pain/Pressure: Present     Pelvic: Cervical exam deferred        Extremities: Normal range of motion.  Edema: None  Mental Status: Normal mood and affect. Normal behavior. Normal judgment and thought content.   Assessment and Plan:  Pregnancy: G3P2002 at [redacted]w[redacted]d 1. Supervision of other normal pregnancy, antepartum 28 week labs and TDaP today - Tdap vaccine greater than or equal to 7yo IM  2. Language barrier affecting health care Spanish interpreter: video used  3. History of gestational diabetes 2 hour today  4. [redacted] weeks gestation of pregnancy   Preterm labor symptoms and general obstetric  precautions including but not limited to vaginal bleeding, contractions, leaking of fluid and fetal movement were reviewed in detail with the patient. Please refer to After Visit Summary for other counseling recommendations.   Return in 2 weeks (on 08/26/2022).  Future Appointments  Date Time Provider Department Center  08/20/2022  3:15 PM [facility] NURSE [facility]  08/20/2022  3:30 PM [facility] US2 [facility]    Reva Bores, MD

## 2022-08-20 ENCOUNTER — Ambulatory Visit: Payer: Medicaid Other | Attending: Maternal & Fetal Medicine

## 2022-08-20 ENCOUNTER — Ambulatory Visit: Payer: Medicaid Other | Admitting: *Deleted

## 2022-08-20 ENCOUNTER — Other Ambulatory Visit: Payer: Self-pay | Admitting: *Deleted

## 2022-08-20 VITALS — BP 112/63 | HR 87

## 2022-08-20 DIAGNOSIS — O09299 Supervision of pregnancy with other poor reproductive or obstetric history, unspecified trimester: Secondary | ICD-10-CM

## 2022-08-20 DIAGNOSIS — O99213 Obesity complicating pregnancy, third trimester: Secondary | ICD-10-CM | POA: Diagnosis not present

## 2022-08-20 DIAGNOSIS — O99212 Obesity complicating pregnancy, second trimester: Secondary | ICD-10-CM

## 2022-08-20 DIAGNOSIS — Z362 Encounter for other antenatal screening follow-up: Secondary | ICD-10-CM | POA: Diagnosis not present

## 2022-08-20 DIAGNOSIS — O09293 Supervision of pregnancy with other poor reproductive or obstetric history, third trimester: Secondary | ICD-10-CM

## 2022-08-20 DIAGNOSIS — O09292 Supervision of pregnancy with other poor reproductive or obstetric history, second trimester: Secondary | ICD-10-CM | POA: Insufficient documentation

## 2022-08-20 DIAGNOSIS — Z8632 Personal history of gestational diabetes: Secondary | ICD-10-CM | POA: Insufficient documentation

## 2022-08-20 DIAGNOSIS — Z3A28 28 weeks gestation of pregnancy: Secondary | ICD-10-CM | POA: Diagnosis not present

## 2022-08-20 DIAGNOSIS — E669 Obesity, unspecified: Secondary | ICD-10-CM

## 2022-08-20 DIAGNOSIS — Z3480 Encounter for supervision of other normal pregnancy, unspecified trimester: Secondary | ICD-10-CM

## 2022-08-20 NOTE — Progress Notes (Unsigned)
.  mfm

## 2022-08-30 ENCOUNTER — Ambulatory Visit (INDEPENDENT_AMBULATORY_CARE_PROVIDER_SITE_OTHER): Payer: Medicaid Other | Admitting: Obstetrics and Gynecology

## 2022-08-30 ENCOUNTER — Other Ambulatory Visit (HOSPITAL_COMMUNITY)
Admission: RE | Admit: 2022-08-30 | Discharge: 2022-08-30 | Disposition: A | Discharge status: Home / Self Care | Payer: Medicaid Other | Source: Ambulatory Visit | Attending: Obstetrics & Gynecology | Admitting: Obstetrics & Gynecology

## 2022-08-30 ENCOUNTER — Encounter: Payer: Self-pay | Admitting: Obstetrics and Gynecology

## 2022-08-30 ENCOUNTER — Other Ambulatory Visit: Payer: Self-pay

## 2022-08-30 VITALS — BP 116/69 | HR 92 | Wt 247.2 lb

## 2022-08-30 DIAGNOSIS — Z3A30 30 weeks gestation of pregnancy: Secondary | ICD-10-CM

## 2022-08-30 DIAGNOSIS — Z3480 Encounter for supervision of other normal pregnancy, unspecified trimester: Secondary | ICD-10-CM | POA: Insufficient documentation

## 2022-08-30 DIAGNOSIS — Z3009 Encounter for other general counseling and advice on contraception: Secondary | ICD-10-CM

## 2022-08-30 DIAGNOSIS — Z3483 Encounter for supervision of other normal pregnancy, third trimester: Secondary | ICD-10-CM

## 2022-08-30 DIAGNOSIS — Z758 Other problems related to medical facilities and other health care: Secondary | ICD-10-CM

## 2022-08-30 DIAGNOSIS — Z603 Acculturation difficulty: Secondary | ICD-10-CM

## 2022-08-30 NOTE — Addendum Note (Signed)
Addended by: Brien Mates T on: 08/30/2022 09:02 AM   Modules accepted: Orders

## 2022-08-30 NOTE — Progress Notes (Signed)
Subjective:  Leah Garcia is a 27 y.o. G3P2002 at [redacted]w[redacted]d being seen today for ongoing prenatal care.  She is currently monitored for the following issues for this low-risk pregnancy and has Maternal morbid obesity, antepartum (HCC); Language barrier affecting health care; History of gestational diabetes; Supervision of other normal pregnancy, antepartum; Unwanted fertility; and BMI 39.0-39.9,adult on their problem list.  Patient reports vaginal irritation.  Contractions: Not present. Vag. Bleeding: None.  Movement: Present. Denies leaking of fluid.   The following portions of the patient's history were reviewed and updated as appropriate: allergies, current medications, past family history, past medical history, past social history, past surgical history and problem list. Problem list updated.  Objective:   Vitals:   08/30/22 0820  BP: 116/69  Pulse: 92  Weight: 247 lb 3.2 oz (112.1 kg)    Fetal Status: Fetal Heart Rate (bpm): 140   Movement: Present     General:  Alert, oriented and cooperative. Patient is in no acute distress.  Skin: Skin is warm and dry. No rash noted.   Cardiovascular: Normal heart rate noted  Respiratory: Normal respiratory effort, no problems with respiration noted  Abdomen: Soft, gravid, appropriate for gestational age. Pain/Pressure: Present     Pelvic:  Cervical exam deferred        Extremities: Normal range of motion.  Edema: None  Mental Status: Normal mood and affect. Normal behavior. Normal judgment and thought content.   Urinalysis:      Assessment and Plan:  Pregnancy: G3P2002 at [redacted]w[redacted]d  1. Supervision of other normal pregnancy, antepartum Stable  2. Unwanted fertility BTL papers  3. Language barrier affecting health care Live interrupter used during today's   Preterm labor symptoms and general obstetric precautions including but not limited to vaginal bleeding, contractions, leaking of fluid and fetal movement were reviewed in  detail with the patient. Please refer to After Visit Summary for other counseling recommendations.  Return in about 2 weeks (around 09/13/2022) for face to face, any provider, OB visit.   Hermina Staggers, MD

## 2022-08-30 NOTE — Patient Instructions (Signed)

## 2022-08-31 ENCOUNTER — Encounter: Payer: Medicaid Other | Admitting: Obstetrics & Gynecology

## 2022-09-01 LAB — CERVICOVAGINAL ANCILLARY ONLY
Bacterial Vaginitis (gardnerella): NEGATIVE
Candida Glabrata: NEGATIVE
Candida Vaginitis: POSITIVE — AB
Comment: NEGATIVE
Comment: NEGATIVE
Comment: NEGATIVE

## 2022-09-02 ENCOUNTER — Telehealth: Payer: Self-pay

## 2022-09-02 DIAGNOSIS — B379 Candidiasis, unspecified: Secondary | ICD-10-CM

## 2022-09-02 MED ORDER — TERCONAZOLE 0.4 % VA CREA: 1.0000 | TOPICAL_CREAM | Freq: Every day | VAGINAL | 0 refills | Status: DC

## 2022-09-02 NOTE — Telephone Encounter (Addendum)
-----   Message from Hermina Staggers sent at 09/01/2022  2:05 PM EDT ----- Please let pt know that her vaginal swab was positive for yeast Send in Rx for Tx  Thanks Jamesetta Orleans pt with Spanish interpreter Eda; results reviewed. Offered Diflucan tablet or Terazol cream. Pt preference for cream because Diflucan did not help in the past. Sent to preferred pharmacy.

## 2022-09-16 ENCOUNTER — Ambulatory Visit: Payer: Medicaid Other | Attending: Obstetrics and Gynecology

## 2022-09-16 DIAGNOSIS — O99213 Obesity complicating pregnancy, third trimester: Secondary | ICD-10-CM

## 2022-09-16 DIAGNOSIS — Z3A32 32 weeks gestation of pregnancy: Secondary | ICD-10-CM | POA: Diagnosis not present

## 2022-09-16 DIAGNOSIS — E669 Obesity, unspecified: Secondary | ICD-10-CM

## 2022-09-16 DIAGNOSIS — O09293 Supervision of pregnancy with other poor reproductive or obstetric history, third trimester: Secondary | ICD-10-CM | POA: Diagnosis not present

## 2022-09-17 ENCOUNTER — Encounter: Payer: Self-pay | Admitting: Obstetrics and Gynecology

## 2022-09-17 ENCOUNTER — Other Ambulatory Visit: Payer: Self-pay

## 2022-09-17 ENCOUNTER — Ambulatory Visit (INDEPENDENT_AMBULATORY_CARE_PROVIDER_SITE_OTHER): Payer: Medicaid Other | Admitting: Obstetrics and Gynecology

## 2022-09-17 VITALS — BP 101/66 | HR 66 | Wt 248.0 lb

## 2022-09-17 DIAGNOSIS — Z603 Acculturation difficulty: Secondary | ICD-10-CM

## 2022-09-17 DIAGNOSIS — Z758 Other problems related to medical facilities and other health care: Secondary | ICD-10-CM

## 2022-09-17 DIAGNOSIS — Z3480 Encounter for supervision of other normal pregnancy, unspecified trimester: Secondary | ICD-10-CM

## 2022-09-17 DIAGNOSIS — Z3009 Encounter for other general counseling and advice on contraception: Secondary | ICD-10-CM

## 2022-09-17 DIAGNOSIS — Z8632 Personal history of gestational diabetes: Secondary | ICD-10-CM

## 2022-09-17 NOTE — Progress Notes (Signed)
Subjective:  Leah Garcia is a 27 y.o. G3P2002 at [redacted]w[redacted]d being seen today for ongoing prenatal care.  She is currently monitored for the following issues for this low-risk pregnancy and has Maternal morbid obesity, antepartum (HCC); Language barrier affecting health care; History of gestational diabetes; Supervision of other normal pregnancy, antepartum; Unwanted fertility; and BMI 39.0-39.9,adult on their problem list.  Patient reports no complaints.  Contractions: Irregular. Vag. Bleeding: None.  Movement: Present. Denies leaking of fluid.   The following portions of the patient's history were reviewed and updated as appropriate: allergies, current medications, past family history, past medical history, past social history, past surgical history and problem list. Problem list updated.  Objective:   Vitals:   09/17/22 0826  BP: 101/66  Pulse: 66  Weight: 248 lb (112.5 kg)    Fetal Status: Fetal Heart Rate (bpm): 140   Movement: Present     General:  Alert, oriented and cooperative. Patient is in no acute distress.  Skin: Skin is warm and dry. No rash noted.   Cardiovascular: Normal heart rate noted  Respiratory: Normal respiratory effort, no problems with respiration noted  Abdomen: Soft, gravid, appropriate for gestational age. Pain/Pressure: Present (pressure)     Pelvic:  Cervical exam deferred        Extremities: Normal range of motion.  Edema: None  Mental Status: Normal mood and affect. Normal behavior. Normal judgment and thought content.   Urinalysis:      Assessment and Plan:  Pregnancy: G3P2002 at [redacted]w[redacted]d  1. Supervision of other normal pregnancy, antepartum Stable Undecided about Flu vaccine  2. History of gestational diabetes Normal Glucola 27 % growth on 09/16/22  3. Unwanted fertility BTL papers signed  4. Language barrier affecting health care Live interrupter used during today's visit  Preterm labor symptoms and general obstetric precautions  including but not limited to vaginal bleeding, contractions, leaking of fluid and fetal movement were reviewed in detail with the patient. Please refer to After Visit Summary for other counseling recommendations.  Return in about 2 weeks (around 10/01/2022) for OB visit, face to face, any provider.   Hermina Staggers, MD

## 2022-10-01 ENCOUNTER — Other Ambulatory Visit: Payer: Self-pay

## 2022-10-01 ENCOUNTER — Ambulatory Visit (INDEPENDENT_AMBULATORY_CARE_PROVIDER_SITE_OTHER): Payer: Medicaid Other | Admitting: Obstetrics & Gynecology

## 2022-10-01 VITALS — BP 106/73 | HR 87 | Wt 249.0 lb

## 2022-10-01 DIAGNOSIS — Z758 Other problems related to medical facilities and other health care: Secondary | ICD-10-CM

## 2022-10-01 DIAGNOSIS — O99213 Obesity complicating pregnancy, third trimester: Secondary | ICD-10-CM

## 2022-10-01 DIAGNOSIS — Z3A34 34 weeks gestation of pregnancy: Secondary | ICD-10-CM

## 2022-10-01 DIAGNOSIS — Z603 Acculturation difficulty: Secondary | ICD-10-CM

## 2022-10-01 DIAGNOSIS — Z3480 Encounter for supervision of other normal pregnancy, unspecified trimester: Secondary | ICD-10-CM

## 2022-10-01 NOTE — Progress Notes (Signed)
   PRENATAL VISIT NOTE  Subjective:  Leah Garcia is a 27 y.o. G3P2002 at [redacted]w[redacted]d being seen today for ongoing prenatal care.  She is currently monitored for the following issues for this high-risk pregnancy and has Maternal morbid obesity, antepartum (HCC); Language barrier affecting health care; History of gestational diabetes; Supervision of other normal pregnancy, antepartum; Unwanted fertility; and BMI 39.0-39.9,adult on their problem list.  Patient reports occasional contractions.  Contractions: Irritability. Vag. Bleeding: None.  Movement: Present. Denies leaking of fluid.   The following portions of the patient's history were reviewed and updated as appropriate: allergies, current medications, past family history, past medical history, past social history, past surgical history and problem list.   Objective:   Vitals:   10/01/22 0820  BP: 106/73  Pulse: 87  Weight: 249 lb (112.9 kg)    Fetal Status: Fetal Heart Rate (bpm): 137   Movement: Present     General:  Alert, oriented and cooperative. Patient is in no acute distress.  Skin: Skin is warm and dry. No rash noted.   Cardiovascular: Normal heart rate noted  Respiratory: Normal respiratory effort, no problems with respiration noted  Abdomen: Soft, gravid, appropriate for gestational age.  Pain/Pressure: Present     Pelvic: Cervical exam deferred        Extremities: Normal range of motion.  Edema: None  Mental Status: Normal mood and affect. Normal behavior. Normal judgment and thought content.   Assessment and Plan:  Pregnancy: G3P2002 at [redacted]w[redacted]d 1. Maternal morbid obesity, antepartum (HCC) Body mass index is 42.74 kg/m.   2. Supervision of other normal pregnancy, antepartum   3. Language barrier affecting health care Spanish interpreter  Preterm labor symptoms and general obstetric precautions including but not limited to vaginal bleeding, contractions, leaking of fluid and fetal movement were  reviewed in detail with the patient. Please refer to After Visit Summary for other counseling recommendations.   Return in about 2 weeks (around 10/15/2022).  Future Appointments  Date Time Provider Department Center  10/13/2022 10:55 AM Nobie Putnam, Cyndi Lennert, MD [facility]    Scheryl Darter, MD

## 2022-10-13 ENCOUNTER — Other Ambulatory Visit (HOSPITAL_COMMUNITY)
Admission: RE | Admit: 2022-10-13 | Discharge: 2022-10-13 | Disposition: A | Discharge status: Home / Self Care | Payer: Medicaid Other | Source: Ambulatory Visit | Attending: Obstetrics & Gynecology | Admitting: Obstetrics & Gynecology

## 2022-10-13 ENCOUNTER — Other Ambulatory Visit: Payer: Self-pay

## 2022-10-13 ENCOUNTER — Ambulatory Visit (INDEPENDENT_AMBULATORY_CARE_PROVIDER_SITE_OTHER): Payer: Medicaid Other | Admitting: Family Medicine

## 2022-10-13 VITALS — BP 102/66 | HR 90 | Wt 252.0 lb

## 2022-10-13 DIAGNOSIS — Z3A36 36 weeks gestation of pregnancy: Secondary | ICD-10-CM

## 2022-10-13 DIAGNOSIS — Z3480 Encounter for supervision of other normal pregnancy, unspecified trimester: Secondary | ICD-10-CM

## 2022-10-13 DIAGNOSIS — Z8632 Personal history of gestational diabetes: Secondary | ICD-10-CM

## 2022-10-13 DIAGNOSIS — O99213 Obesity complicating pregnancy, third trimester: Secondary | ICD-10-CM

## 2022-10-13 DIAGNOSIS — Z603 Acculturation difficulty: Secondary | ICD-10-CM

## 2022-10-13 DIAGNOSIS — Z758 Other problems related to medical facilities and other health care: Secondary | ICD-10-CM

## 2022-10-13 NOTE — Progress Notes (Signed)
   PRENATAL VISIT NOTE  Subjective:  Leah Garcia is a 27 y.o. G3P2002 at [redacted]w[redacted]d being seen today for ongoing prenatal care.  She is currently monitored for the following issues for this high-risk pregnancy and has Maternal morbid obesity, antepartum (HCC); Language barrier affecting health care; History of gestational diabetes; Supervision of other normal pregnancy, antepartum; Unwanted fertility; and BMI 39.0-39.9,adult on their problem list.  Patient reports no complaints.  Contractions: Irritability. Vag. Bleeding: None.  Movement: Present. Denies leaking of fluid.   The following portions of the patient's history were reviewed and updated as appropriate: allergies, current medications, past family history, past medical history, past social history, past surgical history and problem list.   Objective:   Vitals:   10/13/22 1105  BP: 102/66  Pulse: 90  Weight: 252 lb (114.3 kg)    Fetal Status: Fetal Heart Rate (bpm): 132   Movement: Present     General:  Alert, oriented and cooperative. Patient is in no acute distress.  Skin: Skin is warm and dry. No rash noted.   Cardiovascular: Normal heart rate noted  Respiratory: Normal respiratory effort, no problems with respiration noted  Abdomen: Soft, gravid, appropriate for gestational age.  Pain/Pressure: Present     Pelvic: Cervical exam deferred        Extremities: Normal range of motion.  Edema: None  Mental Status: Normal mood and affect. Normal behavior. Normal judgment and thought content.   Assessment and Plan:  Pregnancy: G3P2002 at [redacted]w[redacted]d 1. Supervision of other normal pregnancy, antepartum Continue routine prenatal care BPM fetal heart rate within normal limits today - Culture, beta strep (group b only) - GC/Chlamydia probe amp ([place])not at [facility]  2. History of gestational diabetes Normal GTT this pregnancy  3. Language barrier affecting health care  4. Maternal morbid obesity, antepartum  (HCC)  5. [redacted] weeks gestation of pregnancy GBS and GC/committee collected today  Preterm labor symptoms and general obstetric precautions including but not limited to vaginal bleeding, contractions, leaking of fluid and fetal movement were reviewed in detail with the patient. Please refer to After Visit Summary for other counseling recommendations.   No follow-ups on file.  Future Appointments  Date Time Provider Department Center  10/19/2022 11:15 AM Milas Hock, MD [facility]    Celedonio Savage, MD

## 2022-10-14 LAB — GC/CHLAMYDIA PROBE AMP (~~LOC~~) NOT AT ARMC
Chlamydia: NEGATIVE
Comment: NEGATIVE
Comment: NORMAL
Neisseria Gonorrhea: NEGATIVE

## 2022-10-16 LAB — CULTURE, BETA STREP (GROUP B ONLY): Strep Gp B Culture: POSITIVE — AB

## 2022-10-19 ENCOUNTER — Encounter: Payer: Self-pay | Admitting: Obstetrics and Gynecology

## 2022-10-19 ENCOUNTER — Ambulatory Visit (INDEPENDENT_AMBULATORY_CARE_PROVIDER_SITE_OTHER): Payer: Medicaid Other | Admitting: Obstetrics and Gynecology

## 2022-10-19 ENCOUNTER — Other Ambulatory Visit: Payer: Self-pay

## 2022-10-19 VITALS — BP 96/58 | HR 116 | Wt 254.0 lb

## 2022-10-19 DIAGNOSIS — Z603 Acculturation difficulty: Secondary | ICD-10-CM

## 2022-10-19 DIAGNOSIS — Z758 Other problems related to medical facilities and other health care: Secondary | ICD-10-CM

## 2022-10-19 DIAGNOSIS — O99213 Obesity complicating pregnancy, third trimester: Secondary | ICD-10-CM

## 2022-10-19 DIAGNOSIS — Z3A37 37 weeks gestation of pregnancy: Secondary | ICD-10-CM

## 2022-10-19 DIAGNOSIS — Z3009 Encounter for other general counseling and advice on contraception: Secondary | ICD-10-CM

## 2022-10-19 DIAGNOSIS — Z3480 Encounter for supervision of other normal pregnancy, unspecified trimester: Secondary | ICD-10-CM

## 2022-10-19 DIAGNOSIS — Z8632 Personal history of gestational diabetes: Secondary | ICD-10-CM

## 2022-10-19 NOTE — Progress Notes (Signed)
   PRENATAL VISIT NOTE  Subjective:  Leah Garcia is a 27 y.o. G3P2002 at [redacted]w[redacted]d being seen today for ongoing prenatal care.  She is currently monitored for the following issues for this low-risk pregnancy and has Maternal morbid obesity, antepartum (HCC); Language barrier affecting health care; History of gestational diabetes; Supervision of other normal pregnancy, antepartum; Unwanted fertility; and BMI 39.0-39.9,adult on their problem list.  Patient reports no complaints.  Contractions: Irritability. Vag. Bleeding: None.  Movement: Present. Denies leaking of fluid.   The following portions of the patient's history were reviewed and updated as appropriate: allergies, current medications, past family history, past medical history, past social history, past surgical history and problem list.   Objective:   Vitals:   10/19/22 1139  BP: (!) 96/58  Pulse: (!) 116  Weight: 254 lb (115.2 kg)    Fetal Status: Fetal Heart Rate (bpm): 164   Movement: Present     General:  Alert, oriented and cooperative. Patient is in no acute distress.  Skin: Skin is warm and dry. No rash noted.   Cardiovascular: Normal heart rate noted  Respiratory: Normal respiratory effort, no problems with respiration noted  Abdomen: Soft, gravid, appropriate for gestational age.  Pain/Pressure: Present     Pelvic: Cervical exam deferred        Extremities: Normal range of motion.  Edema: None  Mental Status: Normal mood and affect. Normal behavior. Normal judgment and thought content.   Assessment and Plan:  Pregnancy: G3P2002 at [redacted]w[redacted]d 1. Unwanted fertility Papers signed. 07/15/22  2. Supervision of other normal pregnancy, antepartum GBS pos - PCN in labor  3. Maternal morbid obesity, antepartum (HCC) Growth normal at last [place] - 27%ile in mid-sept.   4. Language barrier affecting health care Spanish interpreter used - Claudia.   5. History of gestational diabetes Normal 2 hr  6. Pregnancy  with 37 weeks completed gestation   Term labor symptoms and general obstetric precautions including but not limited to vaginal bleeding, contractions, leaking of fluid and fetal movement were reviewed in detail with the patient. Please refer to After Visit Summary for other counseling recommendations.   Return in about 1 week (around 10/26/2022) for OB VISIT, MD or APP.  No future appointments.   Milas Hock, MD

## 2022-10-28 ENCOUNTER — Ambulatory Visit: Payer: Medicaid Other | Admitting: Obstetrics and Gynecology

## 2022-10-28 ENCOUNTER — Encounter: Payer: Self-pay | Admitting: Obstetrics and Gynecology

## 2022-10-28 ENCOUNTER — Other Ambulatory Visit: Payer: Self-pay

## 2022-10-28 VITALS — BP 111/75 | HR 103 | Wt 253.6 lb

## 2022-10-28 DIAGNOSIS — Z8632 Personal history of gestational diabetes: Secondary | ICD-10-CM

## 2022-10-28 DIAGNOSIS — Z603 Acculturation difficulty: Secondary | ICD-10-CM

## 2022-10-28 DIAGNOSIS — Z3A38 38 weeks gestation of pregnancy: Secondary | ICD-10-CM

## 2022-10-28 DIAGNOSIS — Z3009 Encounter for other general counseling and advice on contraception: Secondary | ICD-10-CM

## 2022-10-28 DIAGNOSIS — Z758 Other problems related to medical facilities and other health care: Secondary | ICD-10-CM

## 2022-10-28 DIAGNOSIS — O99213 Obesity complicating pregnancy, third trimester: Secondary | ICD-10-CM

## 2022-10-28 DIAGNOSIS — Z3480 Encounter for supervision of other normal pregnancy, unspecified trimester: Secondary | ICD-10-CM

## 2022-10-28 NOTE — Progress Notes (Signed)
   PRENATAL VISIT NOTE  Subjective:  Leah Garcia is a 27 y.o. G3P2002 at [redacted]w[redacted]d being seen today for ongoing prenatal care.  She is currently monitored for the following issues for this low-risk pregnancy and has Maternal morbid obesity, antepartum (HCC); Language barrier affecting health care; History of gestational diabetes; Supervision of other normal pregnancy, antepartum; Unwanted fertility; and BMI 39.0-39.9,adult on their problem list.  Patient reports no complaints.  Contractions: Not present. Vag. Bleeding: None.  Movement: Present. Denies leaking of fluid.   The following portions of the patient's history were reviewed and updated as appropriate: allergies, current medications, past family history, past medical history, past social history, past surgical history and problem list.   Objective:   Vitals:   10/28/22 1559  BP: 111/75  Pulse: (!) 103  Weight: 253 lb 9.6 oz (115 kg)    Fetal Status: Fetal Heart Rate (bpm): 135 Fundal Height: 38 cm Movement: Present     General:  Alert, oriented and cooperative. Patient is in no acute distress.  Skin: Skin is warm and dry. No rash noted.   Cardiovascular: Normal heart rate noted  Respiratory: Normal respiratory effort, no problems with respiration noted  Abdomen: Soft, gravid, appropriate for gestational age.  Pain/Pressure: Present     Pelvic: Cervical exam deferred        Extremities: Normal range of motion.  Edema: None  Mental Status: Normal mood and affect. Normal behavior. Normal judgment and thought content.   Assessment and Plan:  Pregnancy: G3P2002 at [redacted]w[redacted]d 1. Unwanted fertility Papers previously signed.   2. Supervision of other normal pregnancy, antepartum GBS pos - pcn in labor Discussed IOL at 41w if goes that far.   3. Maternal morbid obesity, antepartum (HCC) Last growth wnl in mid sept.   4. History of gestational diabetes Normal 2 hr this pregnancy.   5. Language barrier affecting  health care Interpreter used throughout.   6. Pregnancy with 38 completed weeks gestation   Term labor symptoms and general obstetric precautions including but not limited to vaginal bleeding, contractions, leaking of fluid and fetal movement were reviewed in detail with the patient. Please refer to After Visit Summary for other counseling recommendations.   Return in about 1 week (around 11/04/2022) for OB VISIT, MD or APP.  No future appointments.   Milas Hock, MD

## 2022-11-05 ENCOUNTER — Encounter: Payer: Self-pay | Admitting: Family Medicine

## 2022-11-05 ENCOUNTER — Other Ambulatory Visit: Payer: Medicaid Other

## 2022-11-05 ENCOUNTER — Encounter (HOSPITAL_COMMUNITY): Payer: Self-pay | Admitting: *Deleted

## 2022-11-05 ENCOUNTER — Ambulatory Visit (INDEPENDENT_AMBULATORY_CARE_PROVIDER_SITE_OTHER): Payer: Medicaid Other | Admitting: Family Medicine

## 2022-11-05 ENCOUNTER — Telehealth (HOSPITAL_COMMUNITY): Payer: Self-pay | Admitting: *Deleted

## 2022-11-05 VITALS — BP 108/72 | HR 103 | Wt 256.8 lb

## 2022-11-05 DIAGNOSIS — O99213 Obesity complicating pregnancy, third trimester: Secondary | ICD-10-CM

## 2022-11-05 DIAGNOSIS — Z3A39 39 weeks gestation of pregnancy: Secondary | ICD-10-CM

## 2022-11-05 DIAGNOSIS — Z3480 Encounter for supervision of other normal pregnancy, unspecified trimester: Secondary | ICD-10-CM

## 2022-11-05 DIAGNOSIS — Z603 Acculturation difficulty: Secondary | ICD-10-CM

## 2022-11-05 DIAGNOSIS — Z758 Other problems related to medical facilities and other health care: Secondary | ICD-10-CM

## 2022-11-05 DIAGNOSIS — Z3009 Encounter for other general counseling and advice on contraception: Secondary | ICD-10-CM

## 2022-11-05 NOTE — Progress Notes (Signed)
   Subjective:  Leah Garcia is a 27 y.o. G3P2002 at [redacted]w[redacted]d being seen today for ongoing prenatal care.  She is currently monitored for the following issues for this low-risk pregnancy and has Maternal morbid obesity, antepartum (HCC); Language barrier affecting health care; History of gestational diabetes; Supervision of other normal pregnancy, antepartum; Unwanted fertility; and BMI 39.0-39.9,adult on their problem list.  Patient reports no complaints.  Contractions: Not present. Vag. Bleeding: None.  Movement: Present. Denies leaking of fluid.   The following portions of the patient's history were reviewed and updated as appropriate: allergies, current medications, past family history, past medical history, past social history, past surgical history and problem list. Problem list updated.  Objective:   Vitals:   11/05/22 0822  BP: 108/72  Pulse: (!) 103  Weight: 256 lb 12.8 oz (116.5 kg)    Fetal Status: Fetal Heart Rate (bpm): 130   Movement: Present  Presentation: Vertex  General:  Alert, oriented and cooperative. Patient is in no acute distress.  Skin: Skin is warm and dry. No rash noted.   Cardiovascular: Normal heart rate noted  Respiratory: Normal respiratory effort, no problems with respiration noted  Abdomen: Soft, gravid, appropriate for gestational age. Pain/Pressure: Present     Pelvic: Vag. Bleeding: None     Cervical exam deferred        Extremities: Normal range of motion.  Edema: None  Mental Status: Normal mood and affect. Normal behavior. Normal judgment and thought content.   Urinalysis:      Assessment and Plan:  Pregnancy: G3P2002 at [redacted]w[redacted]d  1. Supervision of other normal pregnancy, antepartum BP and FHR normal Discussed post dates IOL Form faxed, orders placed Vertex confirmed on [place]  2. Unwanted fertility Consent signed 07/15/2022 Confirmed she still desires BTL  3. Language barrier affecting health care Spanish   4. Maternal morbid  obesity, antepartum (HCC)   Term labor symptoms and general obstetric precautions including but not limited to vaginal bleeding, contractions, leaking of fluid and fetal movement were reviewed in detail with the patient. Please refer to After Visit Summary for other counseling recommendations.  Return in 1 week (on 11/12/2022) for [facility], ob visit.   Venora Maples, MD

## 2022-11-05 NOTE — Patient Instructions (Signed)
Opciones de mtodos anticonceptivos Birth Control Options Los mtodos anticonceptivos tambin se denominan anticonceptivos. Los anticonceptivos previenen Firefighter. Hay muchos tipos de anticonceptivos. Trabaje con el mdico para encontrar la opcin ms adecuada para usted. Anticonceptivos que [place] hormonas Estos tipos de anticonceptivos contienen hormonas para Neurosurgeon. Implante anticonceptivo Este es un pequeo tubo que se coloca dentro de la piel del brazo. El tubo Insurance claims handler colocado durante 3 aos como mximo. Inyeccin anticonceptiva Son inyecciones que se aplican cada 3 meses. Pldoras anticonceptivas Esta es una pldora que se toma todos [place]. Debe tomarla a la [employer]. Parche anticonceptivo Este es un parche que se coloca sobre la piel. Se debe cambiar 1 vez por semana durante 3 semanas. Despus de [employer], el parche se debe retirar durante 1semana. Anillo vaginal  Este es un anillo de plstico blando que se coloca dentro de la vagina. El anillo se deja colocado durante 3 semanas. Luego, se debe retirar durante 1 semana. Despus se coloca un nuevo anillo. Mtodos de barrera  Preservativo masculino Este es una cubierta delgada que se coloca sobre el pene antes de tener sexo. El preservativo se desecha despus de Doctor, hospital. Preservativo femenino Este es una cubierta blanda y suelta que se coloca en la vagina antes de [place]. El preservativo se desecha despus de Doctor, hospital. Diafragma El diafragma es una barrera blanda con forma de tazn. Debe estar hecho para adaptarse a su cuerpo. Se coloca en la vagina antes de tener sexo con una sustancia qumica que destruye los espermatozoides llamada espermicida. El Designer, fashion/clothing en la vagina durante 6 a 8 horas despus de tener sexo y debe retirarse en un plazo de 24 horas. El diafragma se debe reemplazar: Cada 1 o 2 aos. Despus de dar a luz. Despus de aumentar ms de 15  libras (6.8kg). Si se somete a una ciruga en la pelvis. Capuchn cervical Este es un capuchn pequeo y [place] se fija sobre el cuello uterino. El cuello uterino es la parte ms baja del tero. Se coloca en la vagina antes del sexo, junto con un espermicida. El capuchn debe fabricarse para usted. El capuchn se debe dejar colocado durante 6 a 8horas despus del sexo. Se debe retirar en un plazo de 48horas. El capuchn cervical debe ser recetado y adaptado a su cuerpo por un mdico. Debe reemplazarse cada 2aos. Esponja Esta es una esponja pequea que se coloca en la vagina antes de [place]. Se debe dejar colocada durante al menos 6horas despus de [employer]. Se debe retirar en un plazo de 30horas y desecharse. Espermicidas Estos son sustancias qumicas que destruyen o impiden que los espermatozoides ingresen al tero. Se pueden presentar en forma de pldora, crema, gel o espuma que se debe colocar en la vagina. Se deben usar al [employer] de 10 a antes de [employer]. Dispositivo intrauterino Un dispositivo intrauterino (DIU) es un dispositivo que un mdico coloca dentro del tero. Existen dos tipos: DIU hormonal. Este tipo puede permanecer colocado durante 3 a 5aos. DIU de cobre. Este tipo Insurance claims handler colocado durante 10aos. Mtodos anticonceptivos permanentes Ligadura de trompas en la mujer Esto es una ciruga para obstruir las trompas de [place]. Esterilizacin histeroscpica Este es un procedimiento para Scientific laboratory technician un dispositivo en cada trompa de [place]. Este mtodo funciona al cabo de 3 meses. Se deben usar otros mtodos anticonceptivos durante 3 meses. Esterilizacin masculina Esta es una [place], llamada vasectoma, para ligar los conductos  que transportan los espermatozoides en los hombres. Este mtodo funciona al cabo de 3 meses. Se deben usar otros mtodos anticonceptivos durante 3 meses. Mtodos de planificacin natural Esto significa no tener [employer] la pareja femenina podra quedar embarazada. A continuacin se mencionan algunos mtodos anticonceptivos por planificacin natural: Usar un calendario a fin de: Hacer un seguimiento de la duracin de cada ciclo menstrual. Determinar en [employer] se podra producir Firefighter. Planificar no tener [employer] en que se podra producir Firefighter. Reconocer los signos de la ovulacin y no tener relaciones sexuales durante ese perodo. La pareja femenina puede detectar cundo ser la ovulacin haciendo un seguimiento de su temperatura todos [place]. Tambin puede examinar si hay cambios en la mucosidad que proviene del cuello uterino. Dnde obtener ms informacin Centers for Disease Control and Prevention (Centros para el Control y la Prevencin de [place]): TonerPromos.no Esta informacin no tiene Theme park manager el consejo del mdico. Asegrese de hacerle al mdico cualquier pregunta que tenga. Document Revised: 04/01/2022 Document Reviewed: 04/01/2022 Elsevier Patient Education  2024 [employer].

## 2022-11-05 NOTE — Telephone Encounter (Signed)
[id number] interpreter numberPreadmission screen

## 2022-11-09 ENCOUNTER — Encounter: Payer: Self-pay | Admitting: General Practice

## 2022-11-10 ENCOUNTER — Other Ambulatory Visit: Payer: Self-pay | Admitting: Advanced Practice Midwife

## 2022-11-11 ENCOUNTER — Ambulatory Visit (INDEPENDENT_AMBULATORY_CARE_PROVIDER_SITE_OTHER): Payer: Medicaid Other

## 2022-11-11 ENCOUNTER — Other Ambulatory Visit: Payer: Self-pay | Admitting: *Deleted

## 2022-11-11 ENCOUNTER — Encounter: Payer: Self-pay | Admitting: General Practice

## 2022-11-11 ENCOUNTER — Other Ambulatory Visit: Payer: Self-pay

## 2022-11-11 ENCOUNTER — Ambulatory Visit: Payer: Self-pay | Admitting: Obstetrics and Gynecology

## 2022-11-11 VITALS — BP 111/73 | HR 94 | Wt 258.2 lb

## 2022-11-11 DIAGNOSIS — Z3A40 40 weeks gestation of pregnancy: Secondary | ICD-10-CM | POA: Diagnosis not present

## 2022-11-11 DIAGNOSIS — O480 Post-term pregnancy: Secondary | ICD-10-CM

## 2022-11-11 DIAGNOSIS — Z3009 Encounter for other general counseling and advice on contraception: Secondary | ICD-10-CM

## 2022-11-11 DIAGNOSIS — O99820 Streptococcus B carrier state complicating pregnancy: Secondary | ICD-10-CM

## 2022-11-11 DIAGNOSIS — Z3480 Encounter for supervision of other normal pregnancy, unspecified trimester: Secondary | ICD-10-CM

## 2022-11-11 NOTE — Progress Notes (Signed)
   PRENATAL VISIT NOTE  Subjective:  Leah Garcia is a 27 y.o. G3P2002 at [redacted]w[redacted]d being seen today for ongoing prenatal care.  She is currently monitored for the following issues for this low-risk pregnancy and has Maternal morbid obesity, antepartum (HCC); Language barrier affecting health care; History of gestational diabetes; Supervision of other normal pregnancy, antepartum; Unwanted fertility; and BMI 39.0-39.9,adult on their problem list.  Patient reports intermittent pressure.  Contractions: Irritability. Vag. Bleeding: None.  Movement: Present. Denies leaking of fluid.   The following portions of the patient's history were reviewed and updated as appropriate: allergies, current medications, past family history, past medical history, past social history, past surgical history and problem list.   Objective:   Vitals:   11/11/22 1620  BP: 111/73  Pulse: 94  Weight: 258 lb 3.2 oz (117.1 kg)    Fetal Status: Fetal Heart Rate (bpm): 153   Movement: Present     General:  Alert, oriented and cooperative. Patient is in no acute distress.  Skin: Skin is warm and dry. No rash noted.   Cardiovascular: Normal heart rate noted  Respiratory: Normal respiratory effort, no problems with respiration noted  Abdomen: Soft, gravid, appropriate for gestational age.  Pain/Pressure: Present     Pelvic: Cervical exam deferred        Extremities: Normal range of motion.  Edema: None  Mental Status: Normal mood and affect. Normal behavior. Normal judgment and thought content.   Assessment and Plan:  Pregnancy: G3P2002 at [redacted]w[redacted]d 1. Supervision of other normal pregnancy, antepartum BP and FHR normal Doing well, feeling regular movement   2. Unwanted fertility BTL consent signed 07/15/22  3. Group B Streptococcus carrier, +RV culture, currently pregnant Tx in labor   4. [redacted] weeks gestation of pregnancy BPP today  Labor precautions discussed Discussed IOL, they will call when they  have a room ready    Term labor symptoms and general obstetric precautions including but not limited to vaginal bleeding, contractions, leaking of fluid and fetal movement were reviewed in detail with the patient. Please refer to After Visit Summary for other counseling recommendations.   Return postpartum   Future Appointments  Date Time Provider Department Center  11/15/2022  7:15 AM [facility] SCHED ROOM [facility] None    Albertine Grates, FNP

## 2022-11-13 ENCOUNTER — Encounter (HOSPITAL_COMMUNITY): Payer: Self-pay | Admitting: Maternal & Fetal Medicine

## 2022-11-13 ENCOUNTER — Inpatient Hospital Stay (HOSPITAL_COMMUNITY)
Admission: AD | Admit: 2022-11-13 | Discharge: 2022-11-13 | Disposition: A | Discharge status: Home / Self Care | Payer: Medicaid Other | Attending: Maternal & Fetal Medicine | Admitting: Maternal & Fetal Medicine

## 2022-11-13 DIAGNOSIS — G44201 Tension-type headache, unspecified, intractable: Secondary | ICD-10-CM | POA: Diagnosis not present

## 2022-11-13 DIAGNOSIS — Z3A40 40 weeks gestation of pregnancy: Secondary | ICD-10-CM | POA: Diagnosis not present

## 2022-11-13 DIAGNOSIS — R110 Nausea: Secondary | ICD-10-CM | POA: Insufficient documentation

## 2022-11-13 DIAGNOSIS — O26893 Other specified pregnancy related conditions, third trimester: Secondary | ICD-10-CM | POA: Insufficient documentation

## 2022-11-13 DIAGNOSIS — Z1152 Encounter for screening for COVID-19: Secondary | ICD-10-CM | POA: Diagnosis not present

## 2022-11-13 DIAGNOSIS — R519 Headache, unspecified: Secondary | ICD-10-CM | POA: Insufficient documentation

## 2022-11-13 LAB — URINALYSIS, ROUTINE W REFLEX MICROSCOPIC
Bilirubin Urine: NEGATIVE
Glucose, UA: NEGATIVE mg/dL
Hgb urine dipstick: NEGATIVE
Ketones, ur: NEGATIVE mg/dL
Nitrite: NEGATIVE
Protein, ur: NEGATIVE mg/dL
Specific Gravity, Urine: 1.021 (ref 1.005–1.030)
pH: 6 (ref 5.0–8.0)

## 2022-11-13 LAB — COMPREHENSIVE METABOLIC PANEL
ALT: 14 U/L (ref 0–44)
AST: 19 U/L (ref 15–41)
Albumin: 2.5 g/dL — ABNORMAL LOW (ref 3.5–5.0)
Alkaline Phosphatase: 139 U/L — ABNORMAL HIGH (ref 38–126)
Anion gap: 8 (ref 5–15)
BUN: 7 mg/dL (ref 6–20)
CO2: 21 mmol/L — ABNORMAL LOW (ref 22–32)
Calcium: 8.6 mg/dL — ABNORMAL LOW (ref 8.9–10.3)
Chloride: 105 mmol/L (ref 98–111)
Creatinine, Ser: 0.67 mg/dL (ref 0.44–1.00)
GFR, Estimated: >60 mL/min (ref >60)
Glucose, Bld: 88 mg/dL (ref 70–99)
Potassium: 3.7 mmol/L (ref 3.5–5.1)
Sodium: 134 mmol/L — ABNORMAL LOW (ref 135–145)
Total Bilirubin: <0.2 mg/dL (ref <1.2)
Total Protein: 6.5 g/dL (ref 6.5–8.1)

## 2022-11-13 LAB — PROTEIN / CREATININE RATIO, URINE
Creatinine, Urine: 213 mg/dL
Protein Creatinine Ratio: 0.12 mg/mg{creat} (ref 0.00–0.15)
Total Protein, Urine: 25 mg/dL

## 2022-11-13 LAB — CBC
HCT: 33.9 % — ABNORMAL LOW (ref 36.0–46.0)
Hemoglobin: 11.1 g/dL — ABNORMAL LOW (ref 12.0–15.0)
MCH: 28 pg (ref 26.0–34.0)
MCHC: 32.7 g/dL (ref 30.0–36.0)
MCV: 85.4 fL (ref 80.0–100.0)
Platelets: 178 10*3/uL (ref 150–400)
RBC: 3.97 MIL/uL (ref 3.87–5.11)
RDW: 14.6 % (ref 11.5–15.5)
WBC: 8.6 10*3/uL (ref 4.0–10.5)
nRBC: 0 % (ref 0.0–0.2)

## 2022-11-13 LAB — RESP PANEL BY RT-PCR (RSV, FLU A&B, COVID)  RVPGX2
Influenza A by PCR: NEGATIVE
Influenza B by PCR: NEGATIVE
Resp Syncytial Virus by PCR: NEGATIVE
SARS Coronavirus 2 by RT PCR: NEGATIVE

## 2022-11-13 MED ORDER — ACETAMINOPHEN-CAFFEINE 500-65 MG PO TABS
2.0000 | ORAL_TABLET | Freq: Once | ORAL | Status: AC
  Administered 2022-11-13: 2 via ORAL
  Filled 2022-11-13: qty 2

## 2022-11-13 MED ORDER — ACETAMINOPHEN-CAFFEINE 500-65 MG PO TABS: 2.0000 | ORAL_TABLET | Freq: Three times a day (TID) | ORAL | 0 refills | Status: DC | PRN

## 2022-11-13 NOTE — MAU Provider Note (Addendum)
HA   S [name] is a 27 y.o. [phone] pregnant female at [redacted]w[redacted]d who presents to MAU today with complaint of severe HA since 11/8. No relief to Tylenol, 500mg  x 6 in last 24hours.  Last dose 2200 11/9. States front, back and behind the eyes.  Also experiencing some nausea that she states as comes with the HA, but states HA is constant and currently denies nausea. Denies weakness and paraesthesias. Denies VB, LOF, CTX.  +FM.  IOL scheduled for 11/11.   Receives care at [facility]. Prenatal records reviewed.  Pertinent items noted in HPI and remainder of comprehensive ROS otherwise negative.   O BP 116/78 (BP Location: Right Arm)   Pulse 93   Temp 98.3 F (36.8 C) (Oral)   Resp 16   Ht 5\' 4"  (1.626 m)   Wt 115.8 kg   SpO2 97%   BMI 43.84 kg/m  Physical Exam Vitals and nursing note reviewed.  Constitutional:      General: She is not in acute distress.    Appearance: She is well-developed. She is obese. She is not ill-appearing.  HENT:     Head: Normocephalic and atraumatic.  Eyes:     Extraocular Movements: Extraocular movements intact.  Cardiovascular:     Rate and Rhythm: Normal rate and regular rhythm.     Heart sounds: No murmur heard.    No friction rub. No gallop.  Pulmonary:     Effort: Pulmonary effort is normal. No respiratory distress.     Breath sounds: No wheezing.  Abdominal:     General: There is no distension.     Palpations: Abdomen is soft.     Comments: gravid  Musculoskeletal:        General: No swelling or tenderness. Normal range of motion.     Cervical back: Normal range of motion.  Skin:    General: Skin is warm and dry.  Neurological:     Mental Status: She is alert and oriented to person, place, and time. Mental status is at baseline.  Psychiatric:        Mood and Affect: Mood normal.        Speech: Speech normal.        Behavior: Behavior normal.      MDM: MAU Course:  Normotensive on admit, however hx of GDM and BMI 43%  so at risk for HTN.  Getting PreE labs.  Will start with Excedrin Tension 1g-130mg .  NST:145bpm, moderate variability, +accels, no decels, rare contractions  PreE labs: UPC 0.12 // 178 // 19/14 Stable Hgb 11.1, Cr 0.67  RVP pending at time of d/c.   UA hazy, trace LE, rare bacteria, overall noninfectious appearing but will order Ucx to ensure no UTI  1156 met with patient at bedside and she states improved symptoms following Excedrin dosing.  Will follow up CMP and send script for Excedrin Tension vs OTC options for patient.  If CMP stable will be able to d/c with plans to f/u RVP and Ucx.   1208 CMP resulted and PreE labs all reassuring, repeat BP normotensive.  Stable for d/c at this time.    A&P: #[redacted] weeks gestation #HA, resolved   Discharge from MAU in stable condition with strict/usual precautions Follow up at [facility] as scheduled for ongoing prenatal care  Allergies as of 11/13/2022   No Known Allergies      Medication List     TAKE these medications    acetaminophen-caffeine 500-65 MG Tabs  per tablet Commonly known as: EXCEDRIN TENSION HEADACHE Take 2 tablets by mouth every 8 (eight) hours as needed.   aspirin EC 81 MG tablet Take 2 tablets (162 mg total) by mouth daily. Swallow whole.   PRENATAL VITAMIN PO Take 1 tablet by mouth daily.        Leah Dibble, MD 11/13/2022 12:09 PM   I provided general supervision for this patient and was immediately available for any clinical concerns.

## 2022-11-13 NOTE — MAU Note (Signed)
.  Leah Garcia is a 27 y.o. at [redacted]w[redacted]d here in MAU reporting: she began to have a severe headache yesterday, continuing into today, that is unresponsive to Tylenol. Reports taking 500 mg of Tylenol, 6x per day. Her headache is all over in the front, back, and behind her eyes. Also having nausea since her HA started. Last dose at 2200 last night. Denies any other complaints, vaginal bleeding,  or LOF. Reports +FM.  PIH Assessment: Headache present: Yes ;Has not responded to treatment and ;Last took Tylenol at 2200 yesterday. Visual disturbances: None RUQ pain/Epigastric: None Atypical edema: None Hx of HBP: Patient denies BP Medications: None prescribed  LMP: N/A Onset of complaint: Yesterday Pain score: 10/10  Vitals:   11/13/22 1051  BP: 116/78  Pulse: 93  Resp: 16  Temp: 98.3 F (36.8 C)  SpO2: 97%     FHT:167 Lab orders placed from triage:  UA

## 2022-11-13 NOTE — Discharge Instructions (Signed)

## 2022-11-14 LAB — CULTURE, OB URINE
Culture: NO GROWTH
Special Requests: NORMAL

## 2022-11-15 ENCOUNTER — Encounter (HOSPITAL_COMMUNITY): Payer: Self-pay | Admitting: Anesthesiology

## 2022-11-15 ENCOUNTER — Other Ambulatory Visit: Payer: Self-pay

## 2022-11-15 ENCOUNTER — Inpatient Hospital Stay (HOSPITAL_COMMUNITY)
Admission: RE | Admit: 2022-11-15 | Discharge: 2022-11-17 | DRG: 798 | Disposition: A | Discharge status: Home / Self Care | Payer: Medicaid Other | Attending: Family Medicine | Admitting: Family Medicine

## 2022-11-15 ENCOUNTER — Inpatient Hospital Stay (HOSPITAL_COMMUNITY): Payer: Medicaid Other

## 2022-11-15 ENCOUNTER — Encounter (HOSPITAL_COMMUNITY): Payer: Self-pay | Admitting: Family Medicine

## 2022-11-15 DIAGNOSIS — Z555 Less than a high school diploma: Secondary | ICD-10-CM | POA: Diagnosis not present

## 2022-11-15 DIAGNOSIS — Z9851 Tubal ligation status: Secondary | ICD-10-CM

## 2022-11-15 DIAGNOSIS — E66813 Obesity, class 3: Secondary | ICD-10-CM | POA: Diagnosis present

## 2022-11-15 DIAGNOSIS — Z3480 Encounter for supervision of other normal pregnancy, unspecified trimester: Secondary | ICD-10-CM

## 2022-11-15 DIAGNOSIS — Z603 Acculturation difficulty: Secondary | ICD-10-CM | POA: Diagnosis present

## 2022-11-15 DIAGNOSIS — O99820 Streptococcus B carrier state complicating pregnancy: Secondary | ICD-10-CM | POA: Diagnosis not present

## 2022-11-15 DIAGNOSIS — O480 Post-term pregnancy: Secondary | ICD-10-CM | POA: Diagnosis not present

## 2022-11-15 DIAGNOSIS — O99214 Obesity complicating childbirth: Secondary | ICD-10-CM | POA: Diagnosis present

## 2022-11-15 DIAGNOSIS — O9902 Anemia complicating childbirth: Secondary | ICD-10-CM | POA: Diagnosis present

## 2022-11-15 DIAGNOSIS — Z758 Other problems related to medical facilities and other health care: Secondary | ICD-10-CM | POA: Diagnosis present

## 2022-11-15 DIAGNOSIS — Z6839 Body mass index (BMI) 39.0-39.9, adult: Secondary | ICD-10-CM

## 2022-11-15 DIAGNOSIS — Z7982 Long term (current) use of aspirin: Secondary | ICD-10-CM

## 2022-11-15 DIAGNOSIS — Z302 Encounter for sterilization: Secondary | ICD-10-CM | POA: Diagnosis not present

## 2022-11-15 DIAGNOSIS — Z8632 Personal history of gestational diabetes: Secondary | ICD-10-CM

## 2022-11-15 DIAGNOSIS — R253 Fasciculation: Secondary | ICD-10-CM | POA: Diagnosis present

## 2022-11-15 DIAGNOSIS — O99824 Streptococcus B carrier state complicating childbirth: Secondary | ICD-10-CM | POA: Diagnosis present

## 2022-11-15 DIAGNOSIS — Z3A41 41 weeks gestation of pregnancy: Secondary | ICD-10-CM

## 2022-11-15 DIAGNOSIS — Z560 Unemployment, unspecified: Secondary | ICD-10-CM | POA: Diagnosis not present

## 2022-11-15 DIAGNOSIS — Z3009 Encounter for other general counseling and advice on contraception: Secondary | ICD-10-CM | POA: Diagnosis present

## 2022-11-15 DIAGNOSIS — O99210 Obesity complicating pregnancy, unspecified trimester: Secondary | ICD-10-CM | POA: Diagnosis present

## 2022-11-15 LAB — CBC
HCT: 33.9 % — ABNORMAL LOW (ref 36.0–46.0)
Hemoglobin: 11.1 g/dL — ABNORMAL LOW (ref 12.0–15.0)
MCH: 27.8 pg (ref 26.0–34.0)
MCHC: 32.7 g/dL (ref 30.0–36.0)
MCV: 85 fL (ref 80.0–100.0)
Platelets: 188 10*3/uL (ref 150–400)
RBC: 3.99 MIL/uL (ref 3.87–5.11)
RDW: 14.5 % (ref 11.5–15.5)
WBC: 10.4 10*3/uL (ref 4.0–10.5)
nRBC: 0 % (ref 0.0–0.2)

## 2022-11-15 LAB — TYPE AND SCREEN
ABO/RH(D): O POS
Antibody Screen: NEGATIVE

## 2022-11-15 LAB — RPR: RPR Ser Ql: NONREACTIVE

## 2022-11-15 MED ORDER — ONDANSETRON HCL 4 MG/2ML IJ SOLN: 4.0000 mg | Freq: Four times a day (QID) | INTRAMUSCULAR | Status: DC | PRN

## 2022-11-15 MED ORDER — FENTANYL-BUPIVACAINE-NACL 0.5-0.125-0.9 MG/250ML-% EP SOLN
12.0000 mL/h | EPIDURAL | Status: DC | PRN
  Filled 2022-11-15: qty 250

## 2022-11-15 MED ORDER — LACTATED RINGERS IV SOLN: INTRAVENOUS | Status: DC

## 2022-11-15 MED ORDER — IBUPROFEN 800 MG PO TABS
800.0000 mg | ORAL_TABLET | Freq: Three times a day (TID) | ORAL | Status: DC
  Administered 2022-11-15 – 2022-11-17 (×5): 800 mg via ORAL
  Filled 2022-11-15 (×4): qty 1

## 2022-11-15 MED ORDER — DIBUCAINE (PERIANAL) 1 % EX OINT: 1.0000 | TOPICAL_OINTMENT | CUTANEOUS | Status: DC | PRN

## 2022-11-15 MED ORDER — LACTATED RINGERS IV SOLN
500.0000 mL | Freq: Once | INTRAVENOUS | Status: AC
  Administered 2022-11-15: 500 mL via INTRAVENOUS

## 2022-11-15 MED ORDER — EPHEDRINE 5 MG/ML INJ: 10.0000 mg | INTRAVENOUS | Status: DC | PRN

## 2022-11-15 MED ORDER — ACETAMINOPHEN 325 MG PO TABS
650.0000 mg | ORAL_TABLET | ORAL | Status: DC | PRN
  Administered 2022-11-15: 650 mg via ORAL
  Filled 2022-11-15: qty 2

## 2022-11-15 MED ORDER — DIPHENHYDRAMINE HCL 25 MG PO CAPS: 25.0000 mg | ORAL_CAPSULE | Freq: Four times a day (QID) | ORAL | Status: DC | PRN

## 2022-11-15 MED ORDER — OXYTOCIN-SODIUM CHLORIDE 30-0.9 UT/500ML-% IV SOLN
2.5000 [IU]/h | INTRAVENOUS | Status: DC
  Administered 2022-11-15: 2.5 [IU]/h via INTRAVENOUS

## 2022-11-15 MED ORDER — TERBUTALINE SULFATE 1 MG/ML IJ SOLN: 0.2500 mg | Freq: Once | INTRAMUSCULAR | Status: DC | PRN

## 2022-11-15 MED ORDER — OXYCODONE HCL 5 MG PO TABS: 5.0000 mg | ORAL_TABLET | Freq: Four times a day (QID) | ORAL | Status: DC | PRN

## 2022-11-15 MED ORDER — SENNOSIDES-DOCUSATE SODIUM 8.6-50 MG PO TABS
2.0000 | ORAL_TABLET | Freq: Every day | ORAL | Status: DC
  Administered 2022-11-17: 2 via ORAL
  Filled 2022-11-15: qty 2

## 2022-11-15 MED ORDER — LIDOCAINE HCL (PF) 1 % IJ SOLN: 30.0000 mL | INTRAMUSCULAR | Status: DC | PRN

## 2022-11-15 MED ORDER — SIMETHICONE 80 MG PO CHEW
80.0000 mg | CHEWABLE_TABLET | ORAL | Status: DC | PRN
Start: 2022-11-15 — End: 2022-11-17

## 2022-11-15 MED ORDER — MEDROXYPROGESTERONE ACETATE 150 MG/ML IM SUSP: 150.0000 mg | INTRAMUSCULAR | Status: DC | PRN

## 2022-11-15 MED ORDER — DIPHENHYDRAMINE HCL 50 MG/ML IJ SOLN: 12.5000 mg | INTRAMUSCULAR | Status: DC | PRN

## 2022-11-15 MED ORDER — OXYTOCIN-SODIUM CHLORIDE 30-0.9 UT/500ML-% IV SOLN
1.0000 m[IU]/min | INTRAVENOUS | Status: DC
  Administered 2022-11-15: 2 m[IU]/min via INTRAVENOUS
  Filled 2022-11-15: qty 500

## 2022-11-15 MED ORDER — PHENYLEPHRINE 80 MCG/ML (10ML) SYRINGE FOR IV PUSH (FOR BLOOD PRESSURE SUPPORT): 80.0000 ug | PREFILLED_SYRINGE | INTRAVENOUS | Status: DC | PRN

## 2022-11-15 MED ORDER — LACTATED RINGERS IV BOLUS: 1000.0000 mL | INTRAVENOUS | Status: DC

## 2022-11-15 MED ORDER — ONDANSETRON HCL 4 MG/2ML IJ SOLN
4.0000 mg | INTRAMUSCULAR | Status: DC | PRN
Start: 2022-11-15 — End: 2022-11-17

## 2022-11-15 MED ORDER — LACTATED RINGERS IV SOLN: INTRAVENOUS | Status: AC

## 2022-11-15 MED ORDER — SODIUM CHLORIDE 0.9 % IV SOLN
5.0000 10*6.[IU] | Freq: Once | INTRAVENOUS | Status: AC
  Administered 2022-11-15: 5 10*6.[IU] via INTRAVENOUS
  Filled 2022-11-15: qty 5

## 2022-11-15 MED ORDER — METOCLOPRAMIDE HCL 10 MG PO TABS
10.0000 mg | ORAL_TABLET | Freq: Once | ORAL | Status: AC
  Administered 2022-11-16: 10 mg via ORAL
  Filled 2022-11-15: qty 1

## 2022-11-15 MED ORDER — SOD CITRATE-CITRIC ACID 500-334 MG/5ML PO SOLN: 30.0000 mL | ORAL | Status: DC | PRN

## 2022-11-15 MED ORDER — ONDANSETRON HCL 4 MG PO TABS: 4.0000 mg | ORAL_TABLET | ORAL | Status: DC | PRN

## 2022-11-15 MED ORDER — COCONUT OIL OIL: 1.0000 | TOPICAL_OIL | Status: DC | PRN

## 2022-11-15 MED ORDER — PENICILLIN G POT IN DEXTROSE 60000 UNIT/ML IV SOLN
3.0000 10*6.[IU] | INTRAVENOUS | Status: DC
  Administered 2022-11-15 (×2): 3 10*6.[IU] via INTRAVENOUS
  Filled 2022-11-15 (×2): qty 50

## 2022-11-15 MED ORDER — FAMOTIDINE 20 MG PO TABS
40.0000 mg | ORAL_TABLET | Freq: Once | ORAL | Status: AC
  Administered 2022-11-16: 40 mg via ORAL
  Filled 2022-11-15: qty 2

## 2022-11-15 MED ORDER — ACETAMINOPHEN 500 MG PO TABS
1000.0000 mg | ORAL_TABLET | Freq: Three times a day (TID) | ORAL | Status: DC
  Administered 2022-11-15 – 2022-11-17 (×6): 1000 mg via ORAL
  Filled 2022-11-15 (×4): qty 2

## 2022-11-15 MED ORDER — OXYTOCIN BOLUS FROM INFUSION
333.0000 mL | Freq: Once | INTRAVENOUS | Status: AC
  Administered 2022-11-15: 333 mL via INTRAVENOUS

## 2022-11-15 MED ORDER — OXYCODONE HCL 5 MG PO TABS: 10.0000 mg | ORAL_TABLET | Freq: Four times a day (QID) | ORAL | Status: DC | PRN

## 2022-11-15 MED ORDER — ZOLPIDEM TARTRATE 5 MG PO TABS: 5.0000 mg | ORAL_TABLET | Freq: Every evening | ORAL | Status: DC | PRN

## 2022-11-15 MED ORDER — FENTANYL CITRATE (PF) 100 MCG/2ML IJ SOLN
50.0000 ug | INTRAMUSCULAR | Status: DC | PRN
  Administered 2022-11-15: 100 ug via INTRAVENOUS
  Filled 2022-11-15: qty 2

## 2022-11-15 MED ORDER — PRENATAL MULTIVITAMIN CH
1.0000 | ORAL_TABLET | Freq: Every day | ORAL | Status: DC
  Administered 2022-11-15 – 2022-11-17 (×2): 1 via ORAL
  Filled 2022-11-15 (×2): qty 1

## 2022-11-15 MED ORDER — BENZOCAINE-MENTHOL 20-0.5 % EX AERO
1.0000 | INHALATION_SPRAY | CUTANEOUS | Status: DC | PRN
  Administered 2022-11-15: 1 via TOPICAL
  Filled 2022-11-15: qty 56

## 2022-11-15 MED ORDER — WITCH HAZEL-GLYCERIN EX PADS
1.0000 | MEDICATED_PAD | CUTANEOUS | Status: DC | PRN
  Administered 2022-11-15: 1 via TOPICAL

## 2022-11-15 NOTE — Discharge Summary (Shared)
Postpartum Discharge Summary  Date of Service updated***     Patient Name: Leah Garcia DOB: Apr 15, 1995 MRN: [id number]  Date of admission: 11/15/2022 Delivery date:11/15/2022 Delivering provider: Joanne Gavel Date of discharge: 11/15/2022  Admitting diagnosis: Post-dates pregnancy [O48.0] Intrauterine pregnancy: [redacted]w[redacted]d     Secondary diagnosis:  Principal Problem:   SVD (spontaneous vaginal delivery) Active Problems:   Maternal morbid obesity, antepartum (HCC)   Language barrier affecting health care   History of gestational diabetes   Supervision of other normal pregnancy, antepartum   Unwanted fertility   BMI 39.0-39.9,adult   Shoulder dystocia, delivered  Additional problems: ***    Discharge diagnosis: Term Pregnancy Delivered                                              Post partum procedures:postpartum tubal ligation Augmentation: AROM and Pitocin Complications: None  Hospital course: Induction of Labor With Vaginal Delivery   27 y.o. yo G3P3003 at [redacted]w[redacted]d was admitted to the hospital 11/15/2022 for induction of labor.  Indication for induction: Postdates.  Patient had an labor course complicated by none. Did have shoulder dystocia with delivery. Membrane Rupture Time/Date: 5:33 AM,11/15/2022  Delivery Method:Vaginal, Spontaneous Operative Delivery:N/A Episiotomy: None Lacerations:  None Details of delivery can be found in separate delivery note.  Patient had a postpartum course complicated by***. Patient is discharged home 11/15/22.  Newborn Data: Birth date:11/15/2022 Birth time:8:35 AM Gender:Female Living status:Living Apgars:8 ,9  Weight:3742 g  Magnesium Sulfate received: No BMZ received: No Rhophylac:No MMR:No T-DaP:Given prenatally Flu: No RSV Vaccine received: No Transfusion:{Transfusion received:30440034}  Immunizations received: Immunization History  Administered Date(s) Administered   Tdap 08/12/2022    Physical  exam  Vitals:   11/15/22 0845 11/15/22 0930 11/15/22 0945 11/15/22 0946  BP: (!) 120/98 128/75 115/72 113/68  Pulse: 87 87 90 90  Resp:      Temp:      TempSrc:      Weight:      Height:       General: {Exam; general:21111117} Lochia: {Desc; appropriate/inappropriate:30686::"appropriate"} Uterine Fundus: {Desc; firm/soft:30687} Incision: {Exam; incision:21111123} DVT Evaluation: {Exam; dvt:2111122} Labs: Lab Results  Component Value Date   WBC 10.4 11/15/2022   HGB 11.1 (L) 11/15/2022   HCT 33.9 (L) 11/15/2022   MCV 85.0 11/15/2022   PLT 188 11/15/2022      Latest Ref Rng & Units 11/13/2022   11:10 AM  CMP  Glucose 70 - 99 mg/dL 88   BUN 6 - 20 mg/dL 7   Creatinine 2.44 - 0.10 mg/dL 2.72   Sodium 536 - 644 mmol/L 134   Potassium 3.5 - 5.1 mmol/L 3.7   Chloride 98 - 111 mmol/L 105   CO2 22 - 32 mmol/L 21   Calcium 8.9 - 10.3 mg/dL 8.6   Total Protein 6.5 - 8.1 g/dL 6.5   Total Bilirubin <0.3 mg/dL <4.7   Alkaline Phos 38 - 126 U/L 139   AST 15 - 41 U/L 19   ALT 0 - 44 U/L 14    Edinburgh Score:    06/22/2021    3:19 PM  Edinburgh Postnatal Depression Scale Screening Tool  I have been able to laugh and see the funny side of things. 0  I have looked forward with enjoyment to things. 0  I have blamed myself unnecessarily when things went  wrong. 0  I have been anxious or worried for no good reason. 0  I have felt scared or panicky for no good reason. 0  Things have been getting on top of me. 0  I have been so unhappy that I have had difficulty sleeping. 0  I have felt sad or miserable. 0  I have been so unhappy that I have been crying. 0  The thought of harming myself has occurred to me. 0  Edinburgh Postnatal Depression Scale Total 0   No data recorded  After visit meds:  Allergies as of 11/15/2022   No Known Allergies   Med Rec must be completed prior to using this [facility]***        Discharge home in stable condition Infant Feeding: {Baby  feeding:23562} Infant Disposition:{CHL IP OB HOME WITH BMWUXL:24401} Discharge instruction: per After Visit Summary and Postpartum booklet. Activity: Advance as tolerated. Pelvic rest for 6 weeks.  Diet: {OB UUVO:53664403} Future Appointments:No future appointments. Follow up Visit:  Message sent to [facility] 11/11  Please schedule this patient for a In person postpartum visit in 6 weeks with the following provider: Any provider. Additional Postpartum F/U: None   Low risk pregnancy complicated by:  none Delivery mode:  Vaginal, Spontaneous Anticipated Birth Control:  BTL done [facility]   11/15/2022 Joanne Gavel, MD

## 2022-11-15 NOTE — H&P (Addendum)
OBSTETRIC ADMISSION HISTORY AND PHYSICAL  Leah Garcia is a 27 y.o. female G3P2002 with IUP at [redacted]w[redacted]d by [place] presenting for post date induction. She reports +FMs, No LOF, no VB, no blurry vision, headaches or peripheral edema, and RUQ pain.  She plans on breast and bottle feeding. She request BTL for birth control. She received her prenatal care at [facility]   Dating: By [place] --->  Estimated Date of Delivery: 11/08/22  Sono:    @[redacted]w[redacted]d , CWD, normal anatomy, cephalic presentation, anterior placental lie, 1885g, 27% EFW   Prenatal History/Complications:  -Hx of GDM  Past Medical History: Past Medical History:  Diagnosis Date   Diverticulitis of colon 11/24/2019   Gestational diabetes    Prediabetes 10/31/2020   A1C at new ob 6.0  Referred to diabetes education  Early gtt to be done     SIRS (systemic inflammatory response syndrome) (HCC) 11/25/2019    Past Surgical History: Past Surgical History:  Procedure Laterality Date   NO PAST SURGERIES      Obstetrical History: OB History     Gravida  3   Para  2   Term  2   Preterm  0   AB  0   Living  2      SAB  0   IAB  0   Ectopic  0   Multiple  0   Live Births  2           Social History Social History   Socioeconomic History   Marital status: Single    Spouse name: Not on file   Number of children: 1   Years of education: Not on file   Highest education level: 9th grade  Occupational History   Occupation: unemployed  Tobacco Use   Smoking status: Never    Passive exposure: Never   Smokeless tobacco: Never  Vaping Use   Vaping status: Never Used  Substance and Sexual Activity   Alcohol use: Not Currently    Alcohol/week: 5.0 standard drinks of alcohol    Types: 5 Cans of beer per week    Comment: 5 beers weekly, mostly on the weekend   Drug use: Never   Sexual activity: Yes    Birth control/protection: None  Other Topics Concern   Not on file  Social History Narrative   Not on  file   Social Determinants of Health   Financial Resource Strain: Patient Declined (11/11/2022)   Overall Financial Resource Strain (CARDIA)    Difficulty of Paying Living Expenses: Patient declined  Food Insecurity: No Food Insecurity (11/11/2022)   Hunger Vital Sign    Worried About Running Out of Food in the Last Year: Never true    Ran Out of Food in the Last Year: Never true  Transportation Needs: No Transportation Needs (11/11/2022)   PRAPARE - Administrator, Civil Service (Medical): No    Lack of Transportation (Non-Medical): No  Physical Activity: Insufficiently Active (11/11/2022)   Exercise Vital Sign    Days of Exercise per Week: 3 days    Minutes of Exercise per Session: 30 min  Stress: No Stress Concern Present (11/11/2022)   [employer] of Occupational Health - Occupational Stress Questionnaire    Feeling of Stress : Not at all  Social Connections: Unknown (11/11/2022)   Social Connection and Isolation Panel [NHANES]    Frequency of Communication with Friends and Family: Once a week    Frequency of Social Gatherings with Friends and  Family: Once a week    Attends Religious Services: Patient declined    Active Member of Clubs or Organizations: No    Attends Engineer, structural: Not on file    Marital Status: Living with partner    Family History: Family History  Problem Relation Age of Onset   Heart disease Neg Hx    Cancer Neg Hx    Diabetes Neg Hx    Hypertension Neg Hx     Allergies: No Known Allergies  Medications Prior to Admission  Medication Sig Dispense Refill Last Dose   acetaminophen-caffeine (EXCEDRIN TENSION HEADACHE) 500-65 MG TABS per tablet Take 2 tablets by mouth every 8 (eight) hours as needed. 60 tablet 0    aspirin EC 81 MG tablet Take 2 tablets (162 mg total) by mouth daily. Swallow whole. (Patient not taking: Reported on 11/05/2022) 60 tablet 11    Prenatal Vit-Fe Fumarate-FA (PRENATAL VITAMIN PO) Take 1 tablet by  mouth daily.        Review of Systems   All systems reviewed and negative except as stated in HPI  Blood pressure 116/71, pulse (!) 120, temperature 98.4 F (36.9 C), temperature source Oral, resp. rate 19, height 5\' 4"  (1.626 m), weight 117.8 kg, currently breastfeeding. General appearance: alert, cooperative, and no distress Lungs: normal WOB Presentation: cephalic Fetal monitoringBaseline: 150 bpm, Variability: Fair (1-6 bpm), Accelerations: Reactive, and Decelerations: Absent Uterine activityNone     Prenatal labs: ABO, Rh: O/Positive/-- (05/14 1126) Antibody: Negative (05/14 1126) Rubella: 2.54 (05/14 1126) RPR: Non Reactive (08/08 0854)  HBsAg: Negative (05/14 1126)  HIV: Non Reactive (08/08 0854)  GBS: Positive/-- (10/09 0150)  2 hr Glucola wnl, normal Genetic screening  low risk Anatomy US nml  Prenatal Transfer Tool  Maternal Diabetes: No Genetic Screening: Normal Maternal Ultrasounds/Referrals: Normal Fetal Ultrasounds or other Referrals:  None Maternal Substance Abuse:  No Significant Maternal Medications:  None Significant Maternal Lab Results:  Group B Strep positive Number of Prenatal Visits:greater than 3 verified prenatal visits Other Comments:  None  No results found for this or any previous visit (from the past 24 hour(s)).  Patient Active Problem List   Diagnosis Date Noted   Post-dates pregnancy 11/15/2022   BMI 39.0-39.9,adult 06/17/2022   Unwanted fertility 05/18/2022   Supervision of other normal pregnancy, antepartum 05/11/2022   History of gestational diabetes 05/02/2021   Language barrier affecting health care 12/10/2020   Maternal morbid obesity, antepartum (HCC) 10/13/2020    Assessment/Plan:  Leah Garcia is a 27 y.o. G3P2002 at [redacted]w[redacted]d here for post date IOL  #Labor: starting pitocin, will AROM at 0400 #Pain: As requested #FWB: Cat 1, reassuring #ID:  GBS positive, started penicillin at 0043 #MOF: breast and  bottle #MOC: BTL #Circ:  unsure  Barrett Shell, Medical Student  11/15/2022, 12:18 AM  Attestation of Supervision of Student:  I confirm that I have verified the information documented in the medical student's note and that I have also personally reperformed the history, physical exam and all medical decision making activities.  I have verified that all services and findings are accurately documented in this student's note; and I agree with management and plan as outlined in the documentation. I have also made any necessary editorial changes.  I performed the cervical exam. She is 3/60/-3. Cervix is soft. Will start Pit x2.  FHT category 1. Will plan AROM around 0415 to allow adequate time for PCN. Discussed plan of care with patient and she is  agreeable. She is spanish speaking but speaks/understands much [place]. Interpreter video still used throughout in case she did not understand anything.   Spouse present at bedside. He will leave for a period of time, but plans to return once she is in more active labor.   Milas Hock, MD Center for [facility], [facility] 11/15/2022 1:23 AM

## 2022-11-15 NOTE — Progress Notes (Signed)
Dr Miquel Dunn is notified that the pt reports R cheek twitching that started 2 hours ago. Pt has no headache, no blurred vision,+2 reflexes, no clonus. BP is 117 64, HR 86, RR 16

## 2022-11-15 NOTE — Progress Notes (Signed)
Pt starting to feel discomfort with contractions.  Blood pressure 118/83, pulse 88, temperature 98.5 F (36.9 C), temperature source Oral, resp. rate 16, height 5\' 4"  (1.626 m), weight 117.8 kg, currently breastfeeding.  Gen: NAD CE: 6/50/-3. AROM to clear.   FHT 150, mod var + accels, no decels Toco Q2 min Pit at 12  Plan:  Continue present management.  GBS pos > PCN Epidural if desired FHT cat 1 Anticipate SVD  Milas Hock, MD Attending Obstetrician & Gynecologist, [facility] for [facility], [facility]

## 2022-11-15 NOTE — Progress Notes (Signed)
S: Called to bedside by RN as pt noted cheek below her left eye twitching. Notes still happening now, has had twitching more of her eyelid before but never this, going for about two hours now. NO pain, headache, vision changes, RUQ pain, other muscle twitching or weakness. No PMH of seizure.  O: Vitals:   11/15/22 1035 11/15/22 1145  BP: 124/60   Pulse: 92 82  Resp: 16 16  Temp: 98.1 F (36.7 C) 98.2 F (36.8 C)  SpO2: 99% 100%    Pt alert and oriented. CN II-XII intact. No fasciculations present. Sensation intact in bilateral face, upper and lower extremities, strength intact in bilateral upper and lower extremities.   A/p: Cheek twitching -discussed in setting of fatigue, but no signs of seizure activity, normotensive, normal neurologic exam - electrolytes normal on CMP on admission - continue to monitor  Burley Saver MD

## 2022-11-15 NOTE — Lactation Note (Addendum)
This note was copied from a baby's chart. Lactation Consultation Note  Patient Name: Leah Garcia [id number] Date: 11/15/2022 Age:25 hours Reason for consult: Initial assessment;Term. In house Spanish Interpretor used : Benjamine Mola, term female infant, Per MOB, infant is breastfeeding well infant recently BF for 10 minutes, less than 1 hour ago, MOB has  no concerns with breastfeeding, MOB is experienced see maternal data below. MOB plans to delay offering infant  formula at this time, will focus only on latching infant at the breast for every feeding to help stimulate and establish her milk supply. Previously her feeding choice was breast and formula feeding. MOB knows if infant BF 1st breast and still cuing to feed, to offer the 2nd breast during the same feeding. LC discussed infant's input and output, infant had 4 voids and 1 stool since birth. Per [employer] as she was leaving the room, infant had another stool, infant had two stools since birth. LC discussed importance of maternal rest, balance diet and hydration. MOB was made aware of O/P services, breastfeeding support groups, community resources, and our phone # for post-discharge questions.    Today's Current feeding plan: 1- MOB will continue to BF infant by cues, on demand, every 2-3 hours, skin to skin. 2- Will delay offering formula supplementation at this time.  3- Focus on maternal rest, balance diet and hydration.  Maternal Data Has patient been taught Hand Expression?: Yes Does the patient have breastfeeding experience prior to this delivery?: Yes How long did the patient breastfeed?: Per MOB, She BF 1st child for 3 years, only stopped BF  2nd child due to pregnancy, MOB BF her 2nd child for  10 months, 2nd child is currently 19 months.  Feeding Mother's Current Feeding Choice: Breast Milk and Formula  LATCH Score  LC did not observe latch, MOB recently BF infant on 10 minutes 1 hour prior to [facility] entering the  room. MOB is experienced with breastfeeding see maternal data.                   Lactation Tools Discussed/Used    Interventions Interventions: Breast feeding basics reviewed;Assisted with latch;Skin to skin;Breast compression;Adjust position;Support pillows;Position options;Expressed milk;LC Services brochure;Education  Discharge Pump: DEBP;Personal  Consult Status Consult Status: Follow-up Date: 11/16/22 Follow-up type: In-patient    Frederico Hamman 11/15/2022, 4:31 PM

## 2022-11-15 NOTE — Anesthesia Preprocedure Evaluation (Signed)
Anesthesia Evaluation  Patient identified by MRN, date of birth, ID band Patient awake    Reviewed: Allergy & Precautions, H&P , NPO status , Patient's Chart, lab work & pertinent test results  History of Anesthesia Complications Negative for: history of anesthetic complications  Airway Mallampati: II       Dental no notable dental hx.    Pulmonary neg pulmonary ROS   Pulmonary exam normal        Cardiovascular negative cardio ROS Normal cardiovascular exam     Neuro/Psych negative neurological ROS  negative psych ROS   GI/Hepatic negative GI ROS, Neg liver ROS,,,  Endo/Other  diabetes, Gestational  Morbid obesity  Renal/GU negative Renal ROS  negative genitourinary   Musculoskeletal   Abdominal  (+) + obese  Peds  Hematology  (+) Blood dyscrasia, anemia   Anesthesia Other Findings   Reproductive/Obstetrics (+) Pregnancy                             Anesthesia Physical Anesthesia Plan  ASA: 3  Anesthesia Plan: Epidural   Post-op Pain Management:    Induction:   PONV Risk Score and Plan:   Airway Management Planned:   Additional Equipment:   Intra-op Plan:   Post-operative Plan:   Informed Consent: I have reviewed the patients History and Physical, chart, labs and discussed the procedure including the risks, benefits and alternatives for the proposed anesthesia with the patient or authorized representative who has indicated his/her understanding and acceptance.       Plan Discussed with:   Anesthesia Plan Comments:        Anesthesia Quick Evaluation

## 2022-11-16 ENCOUNTER — Inpatient Hospital Stay (HOSPITAL_COMMUNITY): Payer: Medicaid Other | Admitting: Anesthesiology

## 2022-11-16 ENCOUNTER — Encounter (HOSPITAL_COMMUNITY)
Admission: RE | Disposition: A | Discharge status: Home / Self Care | Payer: Self-pay | Source: Home / Self Care | Attending: Family Medicine

## 2022-11-16 ENCOUNTER — Other Ambulatory Visit: Payer: Self-pay

## 2022-11-16 ENCOUNTER — Encounter (HOSPITAL_COMMUNITY): Payer: Self-pay | Admitting: Family Medicine

## 2022-11-16 DIAGNOSIS — Z9851 Tubal ligation status: Secondary | ICD-10-CM

## 2022-11-16 DIAGNOSIS — Z302 Encounter for sterilization: Secondary | ICD-10-CM | POA: Diagnosis not present

## 2022-11-16 HISTORY — PX: TUBAL LIGATION: SHX77

## 2022-11-16 SURGERY — LIGATION, FALLOPIAN TUBE, POSTPARTUM
Anesthesia: Spinal | Laterality: Bilateral

## 2022-11-16 MED ORDER — ACETAMINOPHEN 10 MG/ML IV SOLN
INTRAVENOUS | Status: AC
  Filled 2022-11-16: qty 100

## 2022-11-16 MED ORDER — PHENYLEPHRINE HCL-NACL 20-0.9 MG/250ML-% IV SOLN
INTRAVENOUS | Status: AC
  Filled 2022-11-16: qty 250

## 2022-11-16 MED ORDER — ONDANSETRON HCL 4 MG/2ML IJ SOLN: 4.0000 mg | Freq: Once | INTRAMUSCULAR | Status: DC | PRN

## 2022-11-16 MED ORDER — DEXAMETHASONE SODIUM PHOSPHATE 4 MG/ML IJ SOLN
INTRAMUSCULAR | Status: DC | PRN
  Administered 2022-11-16: 8 mg via INTRAVENOUS

## 2022-11-16 MED ORDER — ONDANSETRON HCL 4 MG/2ML IJ SOLN
INTRAMUSCULAR | Status: DC | PRN
  Administered 2022-11-16: 4 mg via INTRAVENOUS

## 2022-11-16 MED ORDER — FENTANYL CITRATE (PF) 100 MCG/2ML IJ SOLN
INTRAMUSCULAR | Status: DC | PRN
  Administered 2022-11-16: 15 ug via INTRATHECAL

## 2022-11-16 MED ORDER — DEXAMETHASONE SODIUM PHOSPHATE 4 MG/ML IJ SOLN
INTRAMUSCULAR | Status: AC
  Filled 2022-11-16: qty 2

## 2022-11-16 MED ORDER — ONDANSETRON HCL 4 MG/2ML IJ SOLN
INTRAMUSCULAR | Status: AC
  Filled 2022-11-16: qty 2

## 2022-11-16 MED ORDER — AMISULPRIDE (ANTIEMETIC) 5 MG/2ML IV SOLN: 10.0000 mg | Freq: Once | INTRAVENOUS | Status: DC | PRN

## 2022-11-16 MED ORDER — BUPIVACAINE HCL (PF) 0.5 % IJ SOLN
INTRAMUSCULAR | Status: AC
Start: 2022-11-16 — End: ?
  Filled 2022-11-16: qty 30

## 2022-11-16 MED ORDER — FENTANYL CITRATE (PF) 100 MCG/2ML IJ SOLN
INTRAMUSCULAR | Status: AC
  Filled 2022-11-16: qty 2

## 2022-11-16 MED ORDER — BUPIVACAINE IN DEXTROSE 0.75-8.25 % IT SOLN
INTRATHECAL | Status: DC | PRN
  Administered 2022-11-16: 1.4 mL via INTRATHECAL

## 2022-11-16 MED ORDER — FENTANYL CITRATE (PF) 100 MCG/2ML IJ SOLN
INTRAMUSCULAR | Status: DC | PRN
  Administered 2022-11-16: 25 ug via INTRAVENOUS

## 2022-11-16 MED ORDER — MIDAZOLAM HCL 2 MG/2ML IJ SOLN
INTRAMUSCULAR | Status: AC
  Filled 2022-11-16: qty 2

## 2022-11-16 MED ORDER — DEXMEDETOMIDINE HCL IN NACL 80 MCG/20ML IV SOLN
INTRAVENOUS | Status: AC
  Filled 2022-11-16: qty 20

## 2022-11-16 MED ORDER — STERILE WATER FOR IRRIGATION IR SOLN
Status: DC | PRN
  Administered 2022-11-16: 1

## 2022-11-16 MED ORDER — DEXMEDETOMIDINE HCL IN NACL 80 MCG/20ML IV SOLN
INTRAVENOUS | Status: DC | PRN
  Administered 2022-11-16: 8 ug via INTRAVENOUS
  Administered 2022-11-16: 4 ug via INTRAVENOUS

## 2022-11-16 MED ORDER — FENTANYL CITRATE (PF) 100 MCG/2ML IJ SOLN: 25.0000 ug | INTRAMUSCULAR | Status: DC | PRN

## 2022-11-16 MED ORDER — BUPIVACAINE HCL (PF) 0.5 % IJ SOLN
INTRAMUSCULAR | Status: DC | PRN
  Administered 2022-11-16: 20 mL

## 2022-11-16 MED ORDER — PHENYLEPHRINE HCL-NACL 20-0.9 MG/250ML-% IV SOLN
INTRAVENOUS | Status: DC | PRN
  Administered 2022-11-16: 30 ug/min via INTRAVENOUS

## 2022-11-16 MED ORDER — MIDAZOLAM HCL 5 MG/5ML IJ SOLN
INTRAMUSCULAR | Status: DC | PRN
  Administered 2022-11-16 (×2): .5 mg via INTRAVENOUS
  Administered 2022-11-16: 1 mg via INTRAVENOUS

## 2022-11-16 SURGICAL SUPPLY — 29 items
APL SKNCLS STERI-STRIP NONHPOA (GAUZE/BANDAGES/DRESSINGS)
BENZOIN TINCTURE PRP APPL 2/3 (GAUZE/BANDAGES/DRESSINGS) IMPLANT
BLADE SURG 11 STRL SS (BLADE) ×1 IMPLANT
CLOTH BEACON ORANGE TIMEOUT ST (SAFETY) ×1 IMPLANT
DRSG OPSITE POSTOP 3X4 (GAUZE/BANDAGES/DRESSINGS) ×1 IMPLANT
DURAPREP 26ML APPLICATOR (WOUND CARE) ×1 IMPLANT
ELECT REM PT RETURN 9FT ADLT (ELECTROSURGICAL) ×1
ELECTRODE REM PT RTRN 9FT ADLT (ELECTROSURGICAL) ×1 IMPLANT
GLOVE BIOGEL PI IND STRL 7.0 (GLOVE) ×3 IMPLANT
GLOVE ECLIPSE 7.0 STRL STRAW (GLOVE) ×1 IMPLANT
GOWN STRL REUS W/TWL LRG LVL3 (GOWN DISPOSABLE) ×2 IMPLANT
LIGASURE IMPACT 36 18CM CVD LR (INSTRUMENTS) IMPLANT
MAT PREVALON FULL STRYKER (MISCELLANEOUS) IMPLANT
NEEDLE HYPO 22GX1.5 SAFETY (NEEDLE) ×1 IMPLANT
NS IRRIG 1000ML POUR BTL (IV SOLUTION) ×1 IMPLANT
PACK ABDOMINAL MINOR (CUSTOM PROCEDURE TRAY) ×1 IMPLANT
PENCIL BUTTON HOLSTER BLD 10FT (ELECTRODE) ×1 IMPLANT
PROTECTOR NERVE ULNAR (MISCELLANEOUS) ×1 IMPLANT
RETRACTOR WOUND ALXS 19CM XSML (INSTRUMENTS) IMPLANT
RTRCTR WOUND ALEXIS 19CM XSML (INSTRUMENTS) ×1
SPONGE LAP 4X18 RFD (DISPOSABLE) IMPLANT
SUT VIC AB 0 CT1 27 (SUTURE) ×1
SUT VIC AB 0 CT1 27XBRD ANBCTR (SUTURE) ×1 IMPLANT
SUT VIC AB CT1 27XBRD ANBCTRL (SUTURE) ×1
SUT VICRYL 4-0 PS2 18IN ABS (SUTURE) ×1 IMPLANT
SYR CONTROL 10ML LL (SYRINGE) ×1 IMPLANT
TOWEL OR 17X24 6PK STRL BLUE (TOWEL DISPOSABLE) ×2 IMPLANT
TRAY FOLEY CATH SILVER 14FR (SET/KITS/TRAYS/PACK) ×1 IMPLANT
WATER STERILE IRR 1000ML POUR (IV SOLUTION) ×1 IMPLANT

## 2022-11-16 NOTE — Anesthesia Postprocedure Evaluation (Signed)
Anesthesia Post Note  Patient: Leah Garcia  Procedure(s) Performed: POST PARTUM TUBAL LIGATION (Bilateral)     Patient location during evaluation: Mother Baby Anesthesia Type: Spinal Level of consciousness: oriented and awake and alert Pain management: pain level controlled Vital Signs Assessment: post-procedure vital signs reviewed and stable Respiratory status: spontaneous breathing and respiratory function stable Cardiovascular status: blood pressure returned to baseline and stable Postop Assessment: no headache, no backache, no apparent nausea or vomiting and able to ambulate Anesthetic complications: no   No notable events documented.  Last Vitals:  Vitals:   11/16/22 1615 11/16/22 1630  BP: 100/75 103/63  Pulse: 76 69  Resp: 18 17  Temp: (!) 36.4 C   SpO2: 99% 99%    Last Pain:  Vitals:   11/16/22 1630  TempSrc:   PainSc: 0-No pain   Pain Goal:    LLE Motor Response: Purposeful movement (11/16/22 1630) LLE Sensation: Tingling (11/16/22 1630) RLE Motor Response: Purposeful movement (11/16/22 1630) RLE Sensation: Tingling (11/16/22 1630)     Epidural/Spinal Function Cutaneous sensation: Tingles (11/16/22 1630), Patient able to flex knees: No (11/16/22 1630), Patient able to lift hips off bed: No (11/16/22 1630), Back pain beyond tenderness at insertion site: No (11/16/22 1630), Progressively worsening motor and/or sensory loss: No (11/16/22 1630), Bowel and/or bladder incontinence post epidural: No (11/16/22 1630)  Trevor Iha

## 2022-11-16 NOTE — Transfer of Care (Signed)
Immediate Anesthesia Transfer of Care Note  Patient: Brione Bothun [name]-Cartagena  Procedure(s) Performed: POST PARTUM TUBAL LIGATION (Bilateral)  Patient Location: PACU  Anesthesia Type:Spinal  Level of Consciousness: awake, alert , and oriented  Airway & Oxygen Therapy: Patient Spontanous Breathing  Post-op Assessment: Report given to RN and Post -op Vital signs reviewed and stable  Post vital signs: Reviewed and stable  Last Vitals:  Vitals Value Taken Time  BP 100/75 11/16/22 1615  Temp    Pulse 77 11/16/22 1618  Resp 22 11/16/22 1618  SpO2 100 % 11/16/22 1618  Vitals shown include unfiled device data.  Last Pain:  Vitals:   11/16/22 1345  TempSrc: Oral  PainSc:          Complications: No notable events documented.

## 2022-11-16 NOTE — Anesthesia Preprocedure Evaluation (Signed)
Anesthesia Evaluation  Patient identified by MRN, date of birth, ID band Patient awake    Reviewed: Allergy & Precautions, NPO status , Patient's Chart, lab work & pertinent test results  Airway Mallampati: II  TM Distance: >3 FB Neck ROM: Full    Dental  (+) Teeth Intact, Dental Advisory Given   Pulmonary neg pulmonary ROS   Pulmonary exam normal breath sounds clear to auscultation       Cardiovascular negative cardio ROS Normal cardiovascular exam Rhythm:Regular Rate:Normal     Neuro/Psych negative neurological ROS     GI/Hepatic negative GI ROS, Neg liver ROS,,,  Endo/Other  diabetes  Class 3 obesity  Renal/GU negative Renal ROS     Musculoskeletal negative musculoskeletal ROS (+)    Abdominal   Peds  Hematology  (+) Blood dyscrasia, anemia Plt 188k   Anesthesia Other Findings Day of surgery medications reviewed with the patient.  Reproductive/Obstetrics [id number] s/p SVD 11/11                             Anesthesia Physical Anesthesia Plan  ASA: 2  Anesthesia Plan: Spinal   Post-op Pain Management:    Induction:   PONV Risk Score and Plan: 2 and Midazolam, Dexamethasone and Ondansetron  Airway Management Planned: Natural Airway  Additional Equipment:   Intra-op Plan:   Post-operative Plan:   Informed Consent: I have reviewed the patients History and Physical, chart, labs and discussed the procedure including the risks, benefits and alternatives for the proposed anesthesia with the patient or authorized representative who has indicated his/her understanding and acceptance.     Dental advisory given and Interpreter used for interview  Plan Discussed with: CRNA, Anesthesiologist and Surgeon  Anesthesia Plan Comments: (Discussed risks and benefits of and differences between spinal and general. Discussed risks of spinal including headache, backache, failure, bleeding,  infection, and nerve damage. Patient consents to spinal. Questions answered. Coagulation studies and platelet count acceptable.)       Anesthesia Quick Evaluation

## 2022-11-16 NOTE — Lactation Note (Signed)
This note was copied from a baby's chart. Lactation Consultation Note  Patient Name: Leah Garcia [id number] Date: 11/16/2022 Age:25 hours Reason for consult: Follow-up assessment  Spanish interpreter used via video Donald Pore. P3,  Mother latched baby upon entering with intermittent swallows.  Recommend offering breast before formula to help establish her milk supply. Feed on demand with cues.  Goal 8-12+ times per day after first 24 hrs.  Mother denies questions or concerns.   Maternal Data Has patient been taught Hand Expression?: Yes  Feeding Mother's Current Feeding Choice: Breast Milk and Formula  LATCH Score Latch: Grasps breast easily, tongue down, lips flanged, rhythmical sucking.  Audible Swallowing: A few with stimulation  Type of Nipple: Everted at rest and after stimulation  Comfort (Breast/Nipple): Soft / non-tender  Hold (Positioning): No assistance needed to correctly position infant at breast.  LATCH Score: 9  Interventions Interventions: Education  Consult Status Consult Status: Follow-up Date: 11/17/22 Follow-up type: In-patient    Dahlia Byes [facility] 11/16/2022, 1:06 PM

## 2022-11-16 NOTE — Op Note (Signed)
Leah Garcia 11/15/2022 - 11/16/2022  PREOPERATIVE DIAGNOSES: Multiparity, undesired fertility  POSTOPERATIVE DIAGNOSES: Multiparity, undesired fertility  PROCEDURE:  Postpartum Bilateral Salpingectomy  SURGEON: Dr.  Jaynie Collins  ASSISTANT:  Dr. Sundra Aland.  An experienced assistant was required given the standard of surgical care given the complexity of the case.  This assistant was needed for exposure, dissection, suctioning, retraction, instrument exchange, and for overall help during the procedure.  ANESTHESIA:  Epidural and local analgesia using 10 ml of 0.5% Marcaine  COMPLICATIONS:  None immediate.  ESTIMATED BLOOD LOSS: 10 ml.  INDICATIONS:  27 y.o. G3O7564 with undesired fertility, status post vaginal delivery, desires permanent sterilization.  Other reversible forms of contraception were discussed with patient; she declines all other modalities. Risks of procedure discussed with patient including but not limited to: risk of regret, permanence of method, bleeding, infection, injury to surrounding organs and need for additional procedures.  Discussed failure risk of 1% with increased risk of ectopic gestation if pregnancy occurs.  Also discussed possibility of post-tubal syndrome with increased pelvic pain or menstrual irregularities.  Patient verbalized understanding of these risks and wants to proceed with sterilization.  Written informed consent obtained.     FINDINGS:  Normal uterus, tubes, and ovaries. Excised fallopian tubes were sent to pathology.   PROCEDURE DETAILS: The patient was taken to the operating room where her epidural anesthesia was dosed up to surgical level and found to be adequate.  She was then placed in the dorsal supine position and prepped and draped in sterile fashion.   After an adequate timeout was performed, attention was turned to the patient's abdomen   A small transverse skin incision was made under the umbilical fold after  local analgesia was administered.. The incision was taken down to the layer of fascia using the scalpel, and fascia was incised, and extended bilaterally using Mayo scissors. The peritoneum was entered in a sharp fashion.    Attention was then turned to the patient's uterus, and left fallopian tube was then identified, and Babcock clamps were used to grasp the tube. The Ligasure device was used to excise the entire left fallopian tube from the underlying mesosalpinx and the uterus.  The right fallopian tube was then identified, grasped with Babcock clamps and excised in a similar fashion using the Ligasure device allowing for bilateral salpingectomy.      Good hemostasis was noted overall.  The instruments were then removed from the patient's abdomen and the fascial incision was repaired with 0 Vicryl, and the skin was closed with a 4-0 Vicryl subcuticular stitch. The patient tolerated the procedure well.  Instrument, sponge, and needle counts were correct times three.  The patient was then taken to the recovery room awake and in stable condition.    Jaynie Collins, MD, FACOG Obstetrician & Gynecologist, [facility] for [employer], [facility]

## 2022-11-16 NOTE — Anesthesia Procedure Notes (Signed)
Spinal  Patient location during procedure: OR Start time: 11/16/2022 2:53 PM End time: 11/16/2022 2:59 PM Reason for block: surgical anesthesia Staffing Performed: anesthesiologist  Anesthesiologist: Collene Schlichter, MD Performed by: Collene Schlichter, MD Authorized by: Collene Schlichter, MD   Preanesthetic Checklist Completed: patient identified, IV checked, risks and benefits discussed, surgical consent, monitors and equipment checked, pre-op evaluation and timeout performed Spinal Block Patient position: sitting Prep: DuraPrep and site prepped and draped Patient monitoring: continuous pulse ox and blood pressure Approach: midline Location: L3-4 Injection technique: single-shot Needle Needle type: Pencan  Needle gauge: 24 G Assessment Events: CSF return and second provider Additional Notes Functioning IV was confirmed and monitors were applied. Sterile prep and drape, including hand hygiene, mask and sterile gloves were used. The patient was positioned and the spine was prepped. The skin was anesthetized with lidocaine.  Free flow of clear CSF was obtained prior to injecting local anesthetic into the CSF.  The spinal needle aspirated freely following injection.  The needle was carefully withdrawn.  The patient tolerated the procedure well. Consent was obtained prior to procedure with all questions answered and concerns addressed. Risks including but not limited to bleeding, infection, nerve damage, paralysis, failed block, inadequate analgesia, allergic reaction, high spinal, itching and headache were discussed and the patient wished to proceed.   Attempt x1 by SRNA, unable to obtain CSF. Attempt x1 by MDA with CSF return.  Arrie Aran, MD

## 2022-11-16 NOTE — Progress Notes (Signed)
Patient ID: Leah Garcia, female   DOB: 1995/08/13, 27 y.o.   MRN: [id number] POSTPARTUM PROGRESS NOTE  Subjective: Leah Garcia is a 27 y.o. [phone] s/p SVD at [redacted]w[redacted]d.  She reports she is doing well. No acute events overnight. She denies any problems with ambulating, voiding or po intake. Denies nausea or vomiting. She has passed flatus. Pain is moderately controlled.  Lochia is appropriate.  Objective: Blood pressure 114/84, pulse 78, temperature 98 F (36.7 C), temperature source Oral, resp. rate 17, height 5\' 4"  (1.626 m), weight 117.8 kg, SpO2 99%, unknown if currently breastfeeding.  Physical Exam:  General: alert, cooperative and no distress Chest: no respiratory distress Abdomen: soft, non-tender  Uterine Fundus: firm and at level of umbilicus Extremities: No calf swelling or tenderness  No edema  Recent Labs    11/13/22 1110 11/15/22 0035  HGB 11.1* 11.1*  HCT 33.9* 33.9*    Assessment/Plan: Leah Garcia is a 27 y.o. [id number] s/p SVD at [redacted]w[redacted]d for post dates.  Routine Postpartum Care: Doing well, pain well-controlled.  -- Continue routine care, lactation support  -- Contraception: BTL scheduled for today 1345, she has been NPO since this morning -- Feeding: breast  Dispo: Plan for discharge this evening or tomorrow morning, patient will let us know how she feels post procedure.  Lester Kinsman, MD PGY2 11/16/2022 7:40 AM

## 2022-11-17 ENCOUNTER — Encounter (HOSPITAL_COMMUNITY): Payer: Self-pay | Admitting: Obstetrics & Gynecology

## 2022-11-17 MED ORDER — OXYCODONE HCL 5 MG PO TABS: 5.0000 mg | ORAL_TABLET | Freq: Three times a day (TID) | ORAL | 0 refills | Status: DC | PRN

## 2022-11-17 MED ORDER — SENNOSIDES-DOCUSATE SODIUM 8.6-50 MG PO TABS: 2.0000 | ORAL_TABLET | Freq: Every day | ORAL | 0 refills | Status: AC

## 2022-11-17 MED ORDER — IBUPROFEN 800 MG PO TABS: 800.0000 mg | ORAL_TABLET | Freq: Three times a day (TID) | ORAL | 0 refills | Status: DC

## 2022-11-17 MED ORDER — ACETAMINOPHEN 500 MG PO TABS: 1000.0000 mg | ORAL_TABLET | Freq: Three times a day (TID) | ORAL | 0 refills | Status: DC

## 2022-11-17 NOTE — Plan of Care (Signed)
  Problem: Education: Goal: Knowledge of General Education information will improve Description: Including pain rating scale, medication(s)/side effects and non-pharmacologic comfort measures Outcome: Progressing   Problem: Health Behavior/Discharge Planning: Goal: Ability to manage health-related needs will improve Outcome: Progressing   Problem: Clinical Measurements: Goal: Ability to maintain clinical measurements within normal limits will improve Outcome: Progressing Goal: Will remain free from infection Outcome: Progressing Goal: Diagnostic test results will improve Outcome: Progressing Goal: Respiratory complications will improve Outcome: Progressing Goal: Cardiovascular complication will be avoided Outcome: Progressing   Problem: Activity: Goal: Risk for activity intolerance will decrease Outcome: Progressing   Problem: Nutrition: Goal: Adequate nutrition will be maintained Outcome: Progressing   Problem: Coping: Goal: Level of anxiety will decrease Outcome: Progressing   Problem: Elimination: Goal: Will not experience complications related to bowel motility Outcome: Progressing Goal: Will not experience complications related to urinary retention Outcome: Progressing   Problem: Pain Management: Goal: General experience of comfort will improve Outcome: Progressing   Problem: Safety: Goal: Ability to remain free from injury will improve Outcome: Progressing   Problem: Skin Integrity: Goal: Risk for impaired skin integrity will decrease Outcome: Progressing   Problem: Education: Goal: Knowledge of Childbirth will improve Outcome: Progressing Goal: Ability to make informed decisions regarding treatment and plan of care will improve Outcome: Progressing Goal: Ability to state and carry out methods to decrease the pain will improve Outcome: Progressing Goal: Individualized Educational Video(s) Outcome: Progressing   Problem: Coping: Goal: Ability to  verbalize concerns and feelings about labor and delivery will improve Outcome: Progressing   Problem: Life Cycle: Goal: Ability to make normal progression through stages of labor will improve Outcome: Progressing Goal: Ability to effectively push during vaginal delivery will improve Outcome: Progressing   Problem: Role Relationship: Goal: Will demonstrate positive interactions with the child Outcome: Progressing   Problem: Safety: Goal: Risk of complications during labor and delivery will decrease Outcome: Progressing   Problem: Pain Management: Goal: Relief or control of pain from uterine contractions will improve Outcome: Progressing   Problem: Education: Goal: Knowledge of condition will improve Outcome: Progressing Goal: Individualized Educational Video(s) Outcome: Progressing Goal: Individualized Newborn Educational Video(s) Outcome: Progressing   Problem: Activity: Goal: Will verbalize the importance of balancing activity with adequate rest periods Outcome: Progressing Goal: Ability to tolerate increased activity will improve Outcome: Progressing   Problem: Coping: Goal: Ability to identify and utilize available resources and services will improve Outcome: Progressing   Problem: Life Cycle: Goal: Chance of risk for complications during the postpartum period will decrease Outcome: Progressing   Problem: Role Relationship: Goal: Ability to demonstrate positive interaction with newborn will improve Outcome: Progressing   Problem: Skin Integrity: Goal: Demonstration of wound healing without infection will improve Outcome: Progressing

## 2022-11-17 NOTE — Plan of Care (Signed)
Problem: Education: Goal: Knowledge of General Education information will improve Description: Including pain rating scale, medication(s)/side effects and non-pharmacologic comfort measures 11/17/2022 1247 by Donne Hazel, LPN Outcome: Adequate for Discharge 11/17/2022 0752 by Donne Hazel, LPN Outcome: Progressing   Problem: Health Behavior/Discharge Planning: Goal: Ability to manage health-related needs will improve 11/17/2022 1247 by Donne Hazel, LPN Outcome: Adequate for Discharge 11/17/2022 0752 by Donne Hazel, LPN Outcome: Progressing   Problem: Clinical Measurements: Goal: Ability to maintain clinical measurements within normal limits will improve 11/17/2022 1247 by Donne Hazel, LPN Outcome: Adequate for Discharge 11/17/2022 0752 by Donne Hazel, LPN Outcome: Progressing Goal: Will remain free from infection 11/17/2022 1247 by Donne Hazel, LPN Outcome: Adequate for Discharge 11/17/2022 0752 by Donne Hazel, LPN Outcome: Progressing Goal: Diagnostic test results will improve 11/17/2022 1247 by Donne Hazel, LPN Outcome: Adequate for Discharge 11/17/2022 0752 by Donne Hazel, LPN Outcome: Progressing Goal: Respiratory complications will improve 11/17/2022 1247 by Donne Hazel, LPN Outcome: Adequate for Discharge 11/17/2022 0752 by Donne Hazel, LPN Outcome: Progressing Goal: Cardiovascular complication will be avoided 11/17/2022 1247 by Donne Hazel, LPN Outcome: Adequate for Discharge 11/17/2022 0752 by Donne Hazel, LPN Outcome: Progressing   Problem: Activity: Goal: Risk for activity intolerance will decrease 11/17/2022 1247 by Donne Hazel, LPN Outcome: Adequate for Discharge 11/17/2022 0752 by Donne Hazel, LPN Outcome: Progressing   Problem: Nutrition: Goal: Adequate nutrition will be maintained 11/17/2022 1247 by Donne Hazel, LPN Outcome: Adequate for Discharge 11/17/2022 0752 by Donne Hazel, LPN Outcome:  Progressing   Problem: Coping: Goal: Level of anxiety will decrease 11/17/2022 1247 by Donne Hazel, LPN Outcome: Adequate for Discharge 11/17/2022 0752 by Donne Hazel, LPN Outcome: Progressing   Problem: Elimination: Goal: Will not experience complications related to bowel motility 11/17/2022 1247 by Donne Hazel, LPN Outcome: Adequate for Discharge 11/17/2022 0752 by Donne Hazel, LPN Outcome: Progressing Goal: Will not experience complications related to urinary retention 11/17/2022 1247 by Donne Hazel, LPN Outcome: Adequate for Discharge 11/17/2022 0752 by Donne Hazel, LPN Outcome: Progressing   Problem: Pain Management: Goal: General experience of comfort will improve 11/17/2022 1247 by Donne Hazel, LPN Outcome: Adequate for Discharge 11/17/2022 0752 by Donne Hazel, LPN Outcome: Progressing   Problem: Safety: Goal: Ability to remain free from injury will improve 11/17/2022 1247 by Donne Hazel, LPN Outcome: Adequate for Discharge 11/17/2022 0752 by Donne Hazel, LPN Outcome: Progressing   Problem: Skin Integrity: Goal: Risk for impaired skin integrity will decrease 11/17/2022 1247 by Donne Hazel, LPN Outcome: Adequate for Discharge 11/17/2022 0752 by Donne Hazel, LPN Outcome: Progressing   Problem: Education: Goal: Knowledge of Childbirth will improve 11/17/2022 1247 by Donne Hazel, LPN Outcome: Adequate for Discharge 11/17/2022 0752 by Donne Hazel, LPN Outcome: Progressing Goal: Ability to make informed decisions regarding treatment and plan of care will improve 11/17/2022 1247 by Donne Hazel, LPN Outcome: Adequate for Discharge 11/17/2022 0752 by Donne Hazel, LPN Outcome: Progressing Goal: Ability to state and carry out methods to decrease the pain will improve 11/17/2022 1247 by Donne Hazel, LPN Outcome: Adequate for Discharge 11/17/2022 0752 by Donne Hazel, LPN Outcome: Progressing Goal: Individualized  Educational Video(s) 11/17/2022 1247 by Donne Hazel, LPN Outcome: Adequate for Discharge 11/17/2022 0752 by Donne Hazel, LPN Outcome: Progressing   Problem: Coping: Goal: Ability to verbalize concerns and feelings  about labor and delivery will improve 11/17/2022 1247 by Donne Hazel, LPN Outcome: Adequate for Discharge 11/17/2022 6644 by Donne Hazel, LPN Outcome: Progressing   Problem: Life Cycle: Goal: Ability to make normal progression through stages of labor will improve 11/17/2022 1247 by Donne Hazel, LPN Outcome: Adequate for Discharge 11/17/2022 0752 by Donne Hazel, LPN Outcome: Progressing Goal: Ability to effectively push during vaginal delivery will improve 11/17/2022 1247 by Donne Hazel, LPN Outcome: Adequate for Discharge 11/17/2022 0752 by Donne Hazel, LPN Outcome: Progressing   Problem: Role Relationship: Goal: Will demonstrate positive interactions with the child 11/17/2022 1247 by Donne Hazel, LPN Outcome: Adequate for Discharge 11/17/2022 0752 by Donne Hazel, LPN Outcome: Progressing   Problem: Safety: Goal: Risk of complications during labor and delivery will decrease 11/17/2022 1247 by Donne Hazel, LPN Outcome: Adequate for Discharge 11/17/2022 0752 by Donne Hazel, LPN Outcome: Progressing   Problem: Pain Management: Goal: Relief or control of pain from uterine contractions will improve 11/17/2022 1247 by Donne Hazel, LPN Outcome: Adequate for Discharge 11/17/2022 0752 by Donne Hazel, LPN Outcome: Progressing   Problem: Education: Goal: Knowledge of condition will improve 11/17/2022 1247 by Donne Hazel, LPN Outcome: Adequate for Discharge 11/17/2022 0752 by Donne Hazel, LPN Outcome: Progressing Goal: Individualized Educational Video(s) 11/17/2022 1247 by Donne Hazel, LPN Outcome: Adequate for Discharge 11/17/2022 0752 by Donne Hazel, LPN Outcome: Progressing Goal: Individualized Newborn  Educational Video(s) 11/17/2022 1247 by Donne Hazel, LPN Outcome: Adequate for Discharge 11/17/2022 0752 by Donne Hazel, LPN Outcome: Progressing   Problem: Activity: Goal: Will verbalize the importance of balancing activity with adequate rest periods 11/17/2022 1247 by Donne Hazel, LPN Outcome: Adequate for Discharge 11/17/2022 0752 by Donne Hazel, LPN Outcome: Progressing Goal: Ability to tolerate increased activity will improve 11/17/2022 1247 by Donne Hazel, LPN Outcome: Adequate for Discharge 11/17/2022 0752 by Donne Hazel, LPN Outcome: Progressing   Problem: Coping: Goal: Ability to identify and utilize available resources and services will improve 11/17/2022 1247 by Donne Hazel, LPN Outcome: Adequate for Discharge 11/17/2022 0752 by Donne Hazel, LPN Outcome: Progressing   Problem: Life Cycle: Goal: Chance of risk for complications during the postpartum period will decrease 11/17/2022 1247 by Donne Hazel, LPN Outcome: Adequate for Discharge 11/17/2022 0752 by Donne Hazel, LPN Outcome: Progressing   Problem: Role Relationship: Goal: Ability to demonstrate positive interaction with newborn will improve 11/17/2022 1247 by Donne Hazel, LPN Outcome: Adequate for Discharge 11/17/2022 0752 by Donne Hazel, LPN Outcome: Progressing   Problem: Skin Integrity: Goal: Demonstration of wound healing without infection will improve 11/17/2022 1247 by Donne Hazel, LPN Outcome: Adequate for Discharge 11/17/2022 0752 by Donne Hazel, LPN Outcome: Progressing

## 2022-11-18 LAB — SURGICAL PATHOLOGY

## 2022-11-27 ENCOUNTER — Telehealth (HOSPITAL_COMMUNITY): Payer: Self-pay

## 2022-11-27 NOTE — Telephone Encounter (Signed)
11/27/2022 1239  Name: Leah Garcia MRN: [id number] DOB: Dec 08, 1995  Reason for Call:  Transition of Care Hospital Discharge Call  Contact Status: Patient Contact Status: Message  Language assistant needed: Interpreter Mode: Telephonic Interpreter Interpreter Name: [phone] Interpreter Phone Number - If applicable: [phone]        Follow-Up Questions:    Inocente Salles Postnatal Depression Scale:  In the Past 7 Days:    PHQ2-9 Depression Scale:     Discharge Follow-up:    Post-discharge interventions: NA  Signature  Signe Colt

## 2022-12-27 ENCOUNTER — Ambulatory Visit: Payer: Medicaid Other | Admitting: Family Medicine

## 2023-01-06 ENCOUNTER — Ambulatory Visit: Payer: Medicaid Other | Admitting: Obstetrics and Gynecology

## 2023-01-06 ENCOUNTER — Other Ambulatory Visit (HOSPITAL_COMMUNITY)
Admission: RE | Admit: 2023-01-06 | Discharge: 2023-01-06 | Disposition: A | Discharge status: Home / Self Care | Payer: Medicaid Other | Source: Ambulatory Visit | Attending: Obstetrics and Gynecology | Admitting: Obstetrics and Gynecology

## 2023-01-06 ENCOUNTER — Other Ambulatory Visit: Payer: Self-pay

## 2023-01-06 VITALS — BP 109/76 | HR 80 | Wt 236.4 lb

## 2023-01-06 DIAGNOSIS — Z6841 Body Mass Index (BMI) 40.0 and over, adult: Secondary | ICD-10-CM | POA: Diagnosis not present

## 2023-01-06 DIAGNOSIS — Z124 Encounter for screening for malignant neoplasm of cervix: Secondary | ICD-10-CM | POA: Insufficient documentation

## 2023-01-06 DIAGNOSIS — R399 Unspecified symptoms and signs involving the genitourinary system: Secondary | ICD-10-CM

## 2023-01-06 DIAGNOSIS — O8613 Vaginitis following delivery: Secondary | ICD-10-CM

## 2023-01-06 LAB — POCT URINALYSIS DIP (DEVICE)
Bilirubin Urine: NEGATIVE
Glucose, UA: NEGATIVE mg/dL
Hgb urine dipstick: NEGATIVE
Ketones, ur: NEGATIVE mg/dL
Leukocytes,Ua: NEGATIVE
Nitrite: NEGATIVE
Protein, ur: NEGATIVE mg/dL
Specific Gravity, Urine: 1.025 (ref 1.005–1.030)
Urobilinogen, UA: 0.2 mg/dL (ref 0.0–1.0)
pH: 6 (ref 5.0–8.0)

## 2023-01-06 NOTE — Progress Notes (Signed)
 Post Partum Visit Note  Leah Garcia is a 28 y.o. [phone] female who presents for a postpartum visit. She is  7.3  weeks postpartum following a normal spontaneous vaginal delivery.  I have fully reviewed the prenatal and intrapartum course. The delivery was at [redacted] weeks gestational weeks.  Anesthesia: none. Postpartum course has been good. Baby is doing well. Baby is feeding by breast. Bleeding staining only. Bowel function is normal. Bladder function is abnormal. Patient reports burning with urination. Patient is sexually active. Contraception method is tubal. Postpartum depression screening: negative.   The pregnancy intention screening data noted above was reviewed. S/P BTL  Edinburgh Postnatal Depression Scale - 01/06/23 0932       Edinburgh Postnatal Depression Scale:  In the Past 7 Days   I have been able to laugh and see the funny side of things. 0    I have looked forward with enjoyment to things. 0    I have blamed myself unnecessarily when things went wrong. 0    I have been anxious or worried for no good reason. 0    I have felt scared or panicky for no good reason. 0    Things have been getting on top of me. 0    I have been so unhappy that I have had difficulty sleeping. 0    I have felt sad or miserable. 0    I have been so unhappy that I have been crying. 0    The thought of harming myself has occurred to me. 0    Edinburgh Postnatal Depression Scale Total 0             Health Maintenance Due  Topic Date Due   FOOT EXAM  Never done   OPHTHALMOLOGY EXAM  Never done   HPV VACCINES (1 - 3-dose series) Never done   Cervical Cancer Screening (Pap smear)  Never done   INFLUENZA VACCINE  Never done   COVID-19 Vaccine (1 - 2024-25 season) Never done   HEMOGLOBIN A1C  11/18/2022    The following portions of the patient's history were reviewed and updated as appropriate: allergies, current medications, past family history, past medical history, past  social history, past surgical history, and problem list.  Review of Systems Pertinent items are noted in HPI.  Objective:  BP 109/76   Pulse 80   Wt 236 lb 6.4 oz (107.2 kg)   BMI 40.58 kg/m    General:  alert, cooperative, and morbidly obese   Breasts:  normal  Lungs: clear to auscultation bilaterally  Heart:  RRR  Abdomen: soft, non-tender; bowel sounds normal; no masses,  no organomegaly   Wound   Well healed   GU exam:   NEFG, no abnormal discharge visualized, cervix clean, Uterus Firm NT     Assessment:   1. Vaginitis affecting pregnancy, postpartum (Primary)  - Cervicovaginal ancillary only( [place]) - U/A obtained ( Negative results)  2. Cervical cancer screening  - Cytology - PAP( [place])   Normal  postpartum exam.   Plan:   Essential components of care per ACOG recommendations:  1.  Mood and well being: Feeling well , no concerns Reviewed local resources for support.  2. Infant care and feeding:  -Patient currently breastmilk feeding? yes  -Social determinants of health (SDOH) reviewed in EPIC. No concerns  3. Sexuality, contraception and birth spacing - Patient does not want a pregnancy in the next year.  Desired family size is  3 children. Does not want any more children - Reviewed reproductive life planning. Reviewed contraceptive methods based on pt preferences and effectiveness.  Patient desired Female Sterilization today.   - Discussed birth spacing of 18 months  4. Sleep and fatigue -Encouraged family/partner/community support of 4 hrs of uninterrupted sleep to help with mood and fatigue  5. Physical Recovery  - Discussed patients delivery and complications. She describes her labor as good. - Patient had a  vaginal delivery with a 50 second shoulder dystocia . Patient with  no   laceration. Perineal healing reviewed. Patient expressed understanding - Patient has urinary incontinence? No. - Patient is safe to resume physical and sexual  activity ( Has previously resumed sexually activity)  6.  Health Maintenance - HM due items addressed Yes - Last pap smear : Pap smear done at today's visit.  -Breast Cancer screening indicated? No.   7. Chronic Disease/Pregnancy Condition follow up: None  - PCP follow up 1 year    Olam Dalton, MSN, [facility], [facility] [employer]

## 2023-01-07 LAB — CERVICOVAGINAL ANCILLARY ONLY
Bacterial Vaginitis (gardnerella): POSITIVE — AB
Candida Glabrata: NEGATIVE
Candida Vaginitis: NEGATIVE
Chlamydia: NEGATIVE
Comment: NEGATIVE
Comment: NEGATIVE
Comment: NEGATIVE
Comment: NEGATIVE
Comment: NEGATIVE
Comment: NORMAL
Neisseria Gonorrhea: NEGATIVE
Trichomonas: NEGATIVE

## 2023-01-09 ENCOUNTER — Other Ambulatory Visit: Payer: Self-pay | Admitting: Nurse Practitioner

## 2023-01-09 DIAGNOSIS — B9689 Other specified bacterial agents as the cause of diseases classified elsewhere: Secondary | ICD-10-CM | POA: Insufficient documentation

## 2023-01-09 MED ORDER — METRONIDAZOLE 500 MG PO TABS: 500.0000 mg | ORAL_TABLET | Freq: Two times a day (BID) | ORAL | 0 refills | Status: AC

## 2023-01-10 LAB — CYTOLOGY - PAP: Diagnosis: NEGATIVE
